# Patient Record
Sex: Female | Born: 1975 | Race: White | Hispanic: No | Marital: Married | Smoking: Never smoker
Health system: Southern US, Community
[De-identification: ages and names within clinical notes are randomized; demographics above are authoritative.]

## PROBLEM LIST (undated history)

## (undated) DIAGNOSIS — Z9289 Personal history of other medical treatment: Secondary | ICD-10-CM

## (undated) DIAGNOSIS — K219 Gastro-esophageal reflux disease without esophagitis: Secondary | ICD-10-CM

## (undated) HISTORY — PX: WISDOM TOOTH EXTRACTION: SHX21

## (undated) HISTORY — DX: Personal history of other medical treatment: Z92.89

---

## 2002-06-27 HISTORY — PX: TUBAL LIGATION: SHX77

## 2009-04-21 ENCOUNTER — Ambulatory Visit: Payer: Self-pay | Admitting: Family Medicine

## 2010-08-02 ENCOUNTER — Ambulatory Visit: Payer: Self-pay

## 2012-06-04 DIAGNOSIS — Z9289 Personal history of other medical treatment: Secondary | ICD-10-CM

## 2012-06-04 HISTORY — DX: Personal history of other medical treatment: Z92.89

## 2012-06-04 LAB — HM MAMMOGRAPHY

## 2014-07-23 DIAGNOSIS — Z9289 Personal history of other medical treatment: Secondary | ICD-10-CM

## 2014-07-23 HISTORY — DX: Personal history of other medical treatment: Z92.89

## 2014-07-23 LAB — HM PAP SMEAR

## 2014-08-08 ENCOUNTER — Emergency Department: Payer: Self-pay | Admitting: Emergency Medicine

## 2014-08-27 ENCOUNTER — Emergency Department: Payer: Self-pay | Admitting: Emergency Medicine

## 2015-12-30 ENCOUNTER — Other Ambulatory Visit: Payer: Self-pay | Admitting: Family Medicine

## 2015-12-30 DIAGNOSIS — Z1231 Encounter for screening mammogram for malignant neoplasm of breast: Secondary | ICD-10-CM

## 2016-01-11 ENCOUNTER — Ambulatory Visit
Admission: RE | Admit: 2016-01-11 | Discharge: 2016-01-11 | Disposition: A | Discharge status: Home / Self Care | Payer: 59 | Source: Ambulatory Visit | Attending: Family Medicine | Admitting: Family Medicine

## 2016-01-11 ENCOUNTER — Other Ambulatory Visit: Payer: Self-pay | Admitting: Family Medicine

## 2016-01-11 DIAGNOSIS — Z1231 Encounter for screening mammogram for malignant neoplasm of breast: Secondary | ICD-10-CM | POA: Insufficient documentation

## 2017-01-03 ENCOUNTER — Other Ambulatory Visit: Payer: Self-pay | Admitting: Family Medicine

## 2017-01-03 DIAGNOSIS — Z1231 Encounter for screening mammogram for malignant neoplasm of breast: Secondary | ICD-10-CM

## 2017-01-04 ENCOUNTER — Encounter: Payer: Self-pay | Admitting: Obstetrics and Gynecology

## 2017-01-05 NOTE — Progress Notes (Signed)
This encounter was created in error - please disregard.

## 2017-01-26 ENCOUNTER — Ambulatory Visit
Admission: RE | Admit: 2017-01-26 | Discharge: 2017-01-26 | Disposition: A | Discharge status: Home / Self Care | Payer: 59 | Source: Ambulatory Visit | Attending: Family Medicine | Admitting: Family Medicine

## 2017-01-26 ENCOUNTER — Ambulatory Visit: Payer: Self-pay | Admitting: Advanced Practice Midwife

## 2017-01-26 DIAGNOSIS — Z1231 Encounter for screening mammogram for malignant neoplasm of breast: Secondary | ICD-10-CM

## 2017-02-23 ENCOUNTER — Ambulatory Visit: Payer: Self-pay | Admitting: Advanced Practice Midwife

## 2017-06-13 ENCOUNTER — Ambulatory Visit (INDEPENDENT_AMBULATORY_CARE_PROVIDER_SITE_OTHER): Payer: 59 | Admitting: Obstetrics and Gynecology

## 2017-06-13 ENCOUNTER — Encounter: Payer: Self-pay | Admitting: Obstetrics and Gynecology

## 2017-06-13 VITALS — BP 112/72 | HR 89 | Ht 62.0 in | Wt 199.0 lb

## 2017-06-13 DIAGNOSIS — Z01419 Encounter for gynecological examination (general) (routine) without abnormal findings: Secondary | ICD-10-CM

## 2017-06-13 DIAGNOSIS — Z1231 Encounter for screening mammogram for malignant neoplasm of breast: Secondary | ICD-10-CM | POA: Diagnosis not present

## 2017-06-13 DIAGNOSIS — Z0000 Encounter for general adult medical examination without abnormal findings: Secondary | ICD-10-CM

## 2017-06-13 DIAGNOSIS — Z1239 Encounter for other screening for malignant neoplasm of breast: Secondary | ICD-10-CM

## 2017-06-13 DIAGNOSIS — Z124 Encounter for screening for malignant neoplasm of cervix: Secondary | ICD-10-CM

## 2017-06-13 NOTE — Progress Notes (Signed)
Gynecology Annual Exam  PCP: Ricardo Jericho, NP  Chief Complaint:  Chief Complaint  Patient presents with  . Gynecologic Exam    History of Present Illness: Patient is a 41 y.o. [id number] presents for annual exam. The patient has no complaints today.   LMP: Patient's last menstrual period was 05/26/2017 (exact date). Average Interval: regular, 28 days Duration of flow: 7 days Heavy Menses: yes Clots: yes Intermenstrual Bleeding: no Postcoital Bleeding: no Dysmenorrhea: no  Reports that the first 4 days of her period she changes a tampon every hour. Also reports that after her menses she has persistent brown discharge for 2 weeks after her period.   The patient is sexually active. She currently uses tubal ligation for contraception. She denies dyspareunia.  The patient does perform self breast exams.  There is no notable family history of breast or ovarian cancer in her family.  The patient wears seatbelts: yes.   The patient has regular exercise: yes.    The patient denies current symptoms of depression.    Review of Systems: ROS  Past Medical History:  Past Medical History:  Diagnosis Date  . History of mammogram 06/04/2012   BENIGN  . History of Papanicolaou smear of cervix 07/23/2014   -/-    Past Surgical History:  Past Surgical History:  Procedure Laterality Date  . CESAREAN SECTION  1997; 2004  . TUBAL LIGATION  2004   [facility]JR  . WISDOM TOOTH EXTRACTION      Gynecologic History:  Patient's last menstrual period was 05/26/2017 (exact date). Contraception: tubal ligation Last Pap: Results were: NIL 2017 Last mammogram: 01/2017 Results were: BI-RAD I Obstetric History: [id number]  Family History:  Family History  Problem Relation Age of Onset  . Heart disease Father   . Hyperlipidemia Father   . Hypertension Father     Social History:  Social History   Socioeconomic History  . Marital status: Married    Spouse name: Not on file  . Number of  children: 2  . Years of education: 27  . Highest education level: Not on file  Social Needs  . Financial resource strain: Not on file  . Food insecurity - worry: Not on file  . Food insecurity - inability: Not on file  . Transportation needs - medical: Not on file  . Transportation needs - non-medical: Not on file  Occupational History  . Occupation: [employer]  Tobacco Use  . Smoking status: Never Smoker  . Smokeless tobacco: Never Used  Substance and Sexual Activity  . Alcohol use: No  . Drug use: No  . Sexual activity: Yes    Birth control/protection: None, Surgical  Other Topics Concern  . Not on file  Social History Narrative  . Not on file    Allergies:  No Known Allergies  Medications: Prior to Admission medications   Medication Sig Start Date End Date Taking? Authorizing Provider  hydrOXYzine (ATARAX/VISTARIL) 25 MG tablet  06/02/17   [provider]    Physical Exam Vitals: Blood pressure 112/72, pulse 89, height 5\' 2"  (1.575 m), weight 199 lb (90.3 kg), last menstrual period 05/26/2017.  OBGyn Exam   Female chaperone present for pelvic and breast  portions of the physical exam  Assessment: 41 y.o. G2P2002 routine annual exam  Plan: Problem List Items Addressed This Visit    None    Visit Diagnoses    Healthcare maintenance    -  Primary   Screening for cervical cancer  Breast cancer screening          1) Mammogram - recommend yearly screening mammogram.  Mammogram Is up to date  2) STI screening was offered and declined  3) ASCCP guidelines and rational discussed.  Patient opts for yearly screening interval  4) Contraception - Education given regarding options for contraception, including Surgical Sterilization including vasectomy.  5) Routine healthcare maintenance including cholesterol, diabetes screening discussed managed by PCP  6) Discussed options for heavy menses including naproxen, IUD, OCP, and ablation. Patient  declines treatment at this time.   7) Discussed options for evaluating persistent brown discharge by sonohystogram or endometrial biopsy. Patient will return if she desires evaluation.

## 2017-06-16 LAB — PAPIG, HPV, RFX 16/18
HPV, HIGH-RISK: NEGATIVE
PAP Smear Comment: 0

## 2017-06-17 NOTE — Progress Notes (Signed)
Called patient and left message that pap smear was normal. Repeat in 5 years.

## 2019-04-04 ENCOUNTER — Other Ambulatory Visit: Payer: Self-pay | Admitting: Physician Assistant

## 2019-04-04 DIAGNOSIS — M62838 Other muscle spasm: Secondary | ICD-10-CM

## 2019-04-04 DIAGNOSIS — R519 Headache, unspecified: Secondary | ICD-10-CM

## 2019-05-16 ENCOUNTER — Ambulatory Visit
Admission: RE | Admit: 2019-05-16 | Discharge: 2019-05-16 | Disposition: A | Discharge status: Home / Self Care | Payer: Managed Care, Other (non HMO) | Source: Ambulatory Visit | Attending: Physician Assistant | Admitting: Physician Assistant

## 2019-05-16 ENCOUNTER — Encounter (INDEPENDENT_AMBULATORY_CARE_PROVIDER_SITE_OTHER): Payer: Self-pay

## 2019-05-16 ENCOUNTER — Other Ambulatory Visit: Payer: Self-pay

## 2019-05-16 DIAGNOSIS — M62838 Other muscle spasm: Secondary | ICD-10-CM | POA: Insufficient documentation

## 2019-05-16 DIAGNOSIS — R519 Headache, unspecified: Secondary | ICD-10-CM | POA: Insufficient documentation

## 2019-08-09 ENCOUNTER — Ambulatory Visit
Admission: EM | Admit: 2019-08-09 | Discharge: 2019-08-09 | Disposition: A | Discharge status: Home / Self Care | Payer: Managed Care, Other (non HMO) | Attending: Emergency Medicine | Admitting: Emergency Medicine

## 2019-08-09 ENCOUNTER — Encounter: Payer: Self-pay | Admitting: Emergency Medicine

## 2019-08-09 ENCOUNTER — Other Ambulatory Visit: Payer: Self-pay

## 2019-08-09 DIAGNOSIS — R067 Sneezing: Secondary | ICD-10-CM | POA: Diagnosis not present

## 2019-08-09 DIAGNOSIS — S39011A Strain of muscle, fascia and tendon of abdomen, initial encounter: Secondary | ICD-10-CM | POA: Diagnosis not present

## 2019-08-09 HISTORY — DX: Gastro-esophageal reflux disease without esophagitis: K21.9

## 2019-08-09 MED ORDER — MELOXICAM 15 MG PO TABS: 15.0000 mg | ORAL_TABLET | Freq: Every day | ORAL | 0 refills | Status: DC

## 2019-08-09 NOTE — ED Provider Notes (Signed)
[facility]    CSN: [id number] Arrival date & time: 08/09/19  1335      History   Chief Complaint Chief Complaint  Patient presents with  . Abdominal Pain    LUQ    HPI Sonia Phillips is a 44 y.o. female.   HPI  44 year old female presents with left upper quadrant abdominal pain that started 2 weeks ago clearly after she sneezed.  This area is mildly tender but has persisted for the 2-week.  Tuesday her primary care office and was prescribed Pepcid for her GERD.  States that this has not been helpful.  Denies any nausea vomiting or diarrhea.  She had no fever or chills.  She has had no shortness of breath.  It is not affected by her sneezing recently.  It is not positional.  No bowel or bladder dysfunction.         Past Medical History:  Diagnosis Date  . GERD (gastroesophageal reflux disease)   . History of mammogram 06/04/2012   BENIGN  . History of Papanicolaou smear of cervix 07/23/2014   -/-    There are no problems to display for this patient.   Past Surgical History:  Procedure Laterality Date  . CESAREAN SECTION  1997; 2004  . TUBAL LIGATION  2004   PJR  . WISDOM TOOTH EXTRACTION      OB History    Gravida  2   Para  2   Term  2   Preterm      AB      Living  2     SAB      TAB      Ectopic      Multiple      Live Births  2            Home Medications    Prior to Admission medications   Medication Sig Start Date End Date Taking? Authorizing Provider  cyclobenzaprine (FLEXERIL) 10 MG tablet Take 5 mg by mouth 3 (three) times daily as needed for muscle spasms.   Yes [provider]  famotidine (PEPCID) 20 MG tablet Take 20 mg by mouth 2 (two) times daily.   Yes [provider]  hydrOXYzine (ATARAX/VISTARIL) 25 MG tablet  06/02/17   [provider]  meloxicam (MOBIC) 15 MG tablet Take 1 tablet (15 mg total) by mouth daily. Take with food 08/09/19   Lorin Picket, PA-C    Family  History Family History  Problem Relation Age of Onset  . Heart disease Father   . Hyperlipidemia Father   . Hypertension Father   . Healthy Mother     Social History Social History   Tobacco Use  . Smoking status: Never Smoker  . Smokeless tobacco: Never Used  Substance Use Topics  . Alcohol use: No  . Drug use: No     Allergies   Patient has no known allergies.   Review of Systems Review of Systems  Constitutional: Positive for activity change. Negative for appetite change, chills, diaphoresis, fatigue and fever.  Musculoskeletal: Positive for back pain and myalgias.  All other systems reviewed and are negative.    Physical Exam Triage Vital Signs ED Triage Vitals  Enc Vitals Group     BP 08/09/19 1417 126/87     Pulse Rate 08/09/19 1417 92     Resp 08/09/19 1417 14     Temp 08/09/19 1417 98.4 F (36.9 C)     Temp Source  08/09/19 1417 Oral     SpO2 08/09/19 1417 100 %     Weight 08/09/19 1414 200 lb (90.7 kg)     Height 08/09/19 1414 5\' 2"  (1.575 m)     Head Circumference --      Peak Flow --      Pain Score 08/09/19 1414 3     Pain Loc --      Pain Edu? --      Excl. in [place]? --    No data found.  Updated Vital Signs BP 126/87 (BP Location: Left Arm)   Pulse 92   Temp 98.4 F (36.9 C) (Oral)   Resp 14   Ht 5\' 2"  (1.575 m)   Wt 200 lb (90.7 kg)   LMP 07/18/2019 (Approximate)   SpO2 100%   BMI 36.58 kg/m   Visual Acuity Right Eye Distance:   Left Eye Distance:   Bilateral Distance:    Right Eye Near:   Left Eye Near:    Bilateral Near:     Physical Exam Vitals and nursing note reviewed. Exam conducted with a chaperone present.  Constitutional:      General: She is not in acute distress.    Appearance: She is well-developed. She is not ill-appearing or toxic-appearing.  HENT:     Head: Normocephalic and atraumatic.  Pulmonary:     Effort: Pulmonary effort is normal.     Breath sounds: Normal breath sounds.  Abdominal:     General:  Bowel sounds are normal. There is no distension. There are signs of injury.     Palpations: Abdomen is soft.     Tenderness: There is abdominal tenderness in the periumbilical area. There is no right CVA tenderness, left CVA tenderness, guarding or rebound.     Hernia: No hernia is present.     Comments: Examination of the abdomen shows no ecchymosis erythema.  Bowel sounds are normal.  There is no costal chondral tenderness.  There is no tenderness sub phrenic.  Area of maximum tenderness is just lateral to the umbilical area on the left in the rectus deep palpation.  This reproduces her symptoms.  She also has pain with lumbar extension is localized to the same area.  Skin:    General: Skin is warm and dry.  Neurological:     General: No focal deficit present.     Mental Status: She is alert and oriented to person, place, and time.  Psychiatric:        Mood and Affect: Mood normal.        Behavior: Behavior normal.      UC Treatments / Results  Labs (all labs ordered are listed, but only abnormal results are displayed) Labs Reviewed - No data to display  EKG   Radiology No results found.  Procedures Procedures (including critical care time)  Medications Ordered in UC Medications - No data to display  Initial Impression / Assessment and Plan / UC Course  I have reviewed the triage vital signs and the nursing notes.  Pertinent labs & imaging results that were available during my care of the patient were reviewed by me and considered in my medical decision making (see chart for details).   44 year old female presents with nominal pain that she experienced after sneezing. Her positioning was in the seated position with her knees drawn up she sneezed.  States that she felt the pain immediately and has remained there for the last 2 weeks.  He was seen  by primary care who treated her for possible GERD with famotidine.  This has not helped.  Examination today shows a very well  localized area of tenderness in the rectus which she likely strained with the sneeze in the positioning she was sitting.  I have prescribed meloxicam that she will take daily with food.  She will continue taking the famotidine.  This may provide some gastric protection.  If she is not improving she should follow-up with her primary care physician.  In addition I have recommended the use of Salonpas or Biofreeze to the area which may be beneficial.   Final Clinical Impressions(s) / UC Diagnoses   Final diagnoses:  Abdominal muscle strain, initial encounter     Discharge Instructions     Take your medications with food.  If you are not improving follow-up with your primary care physician.  Use Biofreeze or Salonpas topically which may be helpful.    ED Prescriptions    Medication Sig Dispense Auth. Provider   meloxicam (MOBIC) 15 MG tablet Take 1 tablet (15 mg total) by mouth daily. Take with food 30 tablet Lorin Picket, PA-C     PDMP not reviewed this encounter.   Lorin Picket, PA-C 08/09/19 1546

## 2019-08-09 NOTE — ED Triage Notes (Signed)
Patient c/o LUQ abdominal pain that started 2 weeks ago after she sneezed.  Patient states that she was seen on Tuesday and was started on Pepcid for GERD.  Patient states that this has not helped.  Patient denies N/V/D.  Patient denies fevers.

## 2019-08-09 NOTE — Discharge Instructions (Addendum)
Take your medications with food.  If you are not improving follow-up with your primary care physician.  Use Biofreeze or Salonpas topically which may be helpful.

## 2019-08-14 ENCOUNTER — Other Ambulatory Visit: Payer: Self-pay | Admitting: Family Medicine

## 2019-08-14 ENCOUNTER — Ambulatory Visit: Payer: 59 | Admitting: Advanced Practice Midwife

## 2019-08-14 DIAGNOSIS — Z1231 Encounter for screening mammogram for malignant neoplasm of breast: Secondary | ICD-10-CM

## 2019-08-21 ENCOUNTER — Ambulatory Visit: Payer: Managed Care, Other (non HMO) | Admitting: Advanced Practice Midwife

## 2019-08-21 ENCOUNTER — Ambulatory Visit: Payer: Managed Care, Other (non HMO)

## 2019-08-28 ENCOUNTER — Ambulatory Visit: Payer: Managed Care, Other (non HMO)

## 2019-10-01 ENCOUNTER — Ambulatory Visit: Payer: Self-pay | Admitting: Family Medicine

## 2019-11-18 ENCOUNTER — Emergency Department
Admission: EM | Admit: 2019-11-18 | Discharge: 2019-11-18 | Disposition: A | Discharge status: Home / Self Care | Payer: Managed Care, Other (non HMO) | Attending: Emergency Medicine | Admitting: Emergency Medicine

## 2019-11-18 ENCOUNTER — Emergency Department: Payer: Managed Care, Other (non HMO)

## 2019-11-18 ENCOUNTER — Other Ambulatory Visit: Payer: Self-pay

## 2019-11-18 DIAGNOSIS — Z79899 Other long term (current) drug therapy: Secondary | ICD-10-CM | POA: Insufficient documentation

## 2019-11-18 DIAGNOSIS — R1012 Left upper quadrant pain: Secondary | ICD-10-CM

## 2019-11-18 DIAGNOSIS — M79605 Pain in left leg: Secondary | ICD-10-CM | POA: Diagnosis not present

## 2019-11-18 LAB — CBC
HCT: 41 % (ref 36.0–46.0)
Hemoglobin: 13.3 g/dL (ref 12.0–15.0)
MCH: 27.5 pg (ref 26.0–34.0)
MCHC: 32.4 g/dL (ref 30.0–36.0)
MCV: 84.7 fL (ref 80.0–100.0)
Platelets: 375 10*3/uL (ref 150–400)
RBC: 4.84 MIL/uL (ref 3.87–5.11)
RDW: 13.7 % (ref 11.5–15.5)
WBC: 8.8 10*3/uL (ref 4.0–10.5)
nRBC: 0 % (ref 0.0–0.2)

## 2019-11-18 LAB — COMPREHENSIVE METABOLIC PANEL
ALT: 17 U/L (ref 0–44)
AST: 22 U/L (ref 15–41)
Albumin: 3.9 g/dL (ref 3.5–5.0)
Alkaline Phosphatase: 69 U/L (ref 38–126)
Anion gap: 11 (ref 5–15)
BUN: 14 mg/dL (ref 6–20)
CO2: 23 mmol/L (ref 22–32)
Calcium: 9.3 mg/dL (ref 8.9–10.3)
Chloride: 103 mmol/L (ref 98–111)
Creatinine, Ser: 0.83 mg/dL (ref 0.44–1.00)
GFR calc Af Amer: >60 mL/min (ref >60)
GFR calc non Af Amer: >60 mL/min (ref >60)
Glucose, Bld: 123 mg/dL — ABNORMAL HIGH (ref 70–99)
Potassium: 4.2 mmol/L (ref 3.5–5.1)
Sodium: 137 mmol/L (ref 135–145)
Total Bilirubin: 0.5 mg/dL (ref 0.3–1.2)
Total Protein: 7.6 g/dL (ref 6.5–8.1)

## 2019-11-18 LAB — LIPASE, BLOOD: Lipase: 40 U/L (ref 11–51)

## 2019-11-18 LAB — URINALYSIS, COMPLETE (UACMP) WITH MICROSCOPIC
Bacteria, UA: NONE SEEN
Bilirubin Urine: NEGATIVE
Glucose, UA: NEGATIVE mg/dL
Hgb urine dipstick: NEGATIVE
Ketones, ur: NEGATIVE mg/dL
Leukocytes,Ua: NEGATIVE
Nitrite: NEGATIVE
Protein, ur: NEGATIVE mg/dL
Specific Gravity, Urine: 1.023 (ref 1.005–1.030)
pH: 5 (ref 5.0–8.0)

## 2019-11-18 MED ORDER — TRAMADOL HCL 50 MG PO TABS: 50.0000 mg | ORAL_TABLET | Freq: Four times a day (QID) | ORAL | 0 refills | Status: DC | PRN

## 2019-11-18 MED ORDER — SODIUM CHLORIDE 0.9% FLUSH: 3.0000 mL | Freq: Once | INTRAVENOUS | Status: DC

## 2019-11-18 NOTE — ED Notes (Signed)
See triage note  Presents with pain to posterior left lower leg  States pain started 2 days ago  Also has been having upper quadrant for several months

## 2019-11-18 NOTE — ED Provider Notes (Signed)
[facility] Emergency Department Provider Note  ____________________________________________   First MD Initiated Contact with Patient 11/18/19 1021     (approximate)  I have reviewed the triage vital signs and the nursing notes.   HISTORY  Chief Complaint Leg Pain and Abdominal Pain    HPI Sonia Phillips is a 44 y.o. female presents emergency department complaining of left lower leg pain that started 2 days ago.  No birth control use, non-smoker, no recent travel.  However patient is very stationary at work.  States pain is in the back of the calf. Complaint #2.  Patient is complaining of left upper quadrant pain.  She has had this since January.  She is scheduled for an endoscopy on Friday.  States it started suddenly in January and felt like a large cramp.  Since then the pain has been constant.  It is located more to the posterior of the trunk than the anterior.      Past Medical History:  Diagnosis Date  . GERD (gastroesophageal reflux disease)   . History of mammogram 06/04/2012   BENIGN  . History of Papanicolaou smear of cervix 07/23/2014   -/-    There are no problems to display for this patient.   Past Surgical History:  Procedure Laterality Date  . CESAREAN SECTION  1997; 2004  . TUBAL LIGATION  2004   PJR  . WISDOM TOOTH EXTRACTION      Prior to Admission medications   Medication Sig Start Date End Date Taking? Authorizing Provider  cyclobenzaprine (FLEXERIL) 10 MG tablet Take 5 mg by mouth 3 (three) times daily as needed for muscle spasms.    [provider]  famotidine (PEPCID) 20 MG tablet Take 20 mg by mouth 2 (two) times daily.    [provider]  hydrOXYzine (ATARAX/VISTARIL) 25 MG tablet  06/02/17   [provider]  meloxicam (MOBIC) 15 MG tablet Take 1 tablet (15 mg total) by mouth daily. Take with food 08/09/19   Lorin Picket, PA-C  traMADol (ULTRAM) 50 MG tablet Take 1 tablet (50 mg  total) by mouth every 6 (six) hours as needed. 11/18/19   Versie Starks, PA-C    Allergies Patient has no known allergies.  Family History  Problem Relation Age of Onset  . Heart disease Father   . Hyperlipidemia Father   . Hypertension Father   . Healthy Mother     Social History Social History   Tobacco Use  . Smoking status: Never Smoker  . Smokeless tobacco: Never Used  Substance Use Topics  . Alcohol use: No  . Drug use: No    Review of Systems  Constitutional: No fever/chills Eyes: No visual changes. ENT: No sore throat. Respiratory: Denies cough Cardiovascular: Denies chest pain Gastrointestinal: Positive abdominal pain Genitourinary: Negative for dysuria. Musculoskeletal: Negative for back pain.  Positive for left leg pain Skin: Negative for rash. Psychiatric: no mood changes,     ____________________________________________   PHYSICAL EXAM:  VITAL SIGNS: ED Triage Vitals  Enc Vitals Group     BP 11/18/19 0929 (!) 148/88     Pulse Rate 11/18/19 0929 (!) 104     Resp 11/18/19 0929 18     Temp 11/18/19 0931 98.2 F (36.8 C)     Temp Source 11/18/19 0929 Oral     SpO2 11/18/19 0929 100 %     Weight 11/18/19 0929 198 lb (89.8 kg)     Height 11/18/19 0929 5'  2" (1.575 m)     Head Circumference --      Peak Flow --      Pain Score 11/18/19 0929 5     Pain Loc --      Pain Edu? --      Excl. in [place]? --     Constitutional: Alert and oriented. Well appearing and in no acute distress. Eyes: Conjunctivae are normal.  Head: Atraumatic. Nose: No congestion/rhinnorhea. Mouth/Throat: Mucous membranes are moist.   Neck:  supple no lymphadenopathy noted Cardiovascular: Normal rate, regular rhythm. Heart sounds are normal Respiratory: Normal respiratory effort.  No retractions, lungs c t a  Abd: soft nontender bs normal all 4 quad, positive for left-sided CVAT GU: deferred Musculoskeletal: FROM all extremities, warm and well perfused, left calf is  tender posteriorly Neurologic:  Normal speech and language.  Skin:  Skin is warm, dry and intact. No rash noted. Psychiatric: Mood and affect are normal. Speech and behavior are normal.  ____________________________________________   LABS (all labs ordered are listed, but only abnormal results are displayed)  Labs Reviewed  COMPREHENSIVE METABOLIC PANEL - Abnormal; Notable for the following components:      Result Value   Glucose, Bld 123 (*)    All other components within normal limits  URINALYSIS, COMPLETE (UACMP) WITH MICROSCOPIC - Abnormal; Notable for the following components:   Color, Urine YELLOW (*)    APPearance CLEAR (*)    All other components within normal limits  LIPASE, BLOOD  CBC  PREGNANCY, URINE   ____________________________________________   ____________________________________________  RADIOLOGY  Ultrasound of the left lower extremity is negative Abdomen and chest 1 views are negative  ____________________________________________   PROCEDURES  Procedure(s) performed: No  Procedures    ____________________________________________   INITIAL IMPRESSION / ASSESSMENT AND PLAN / ED COURSE  Pertinent labs & imaging results that were available during my care of the patient were reviewed by me and considered in my medical decision making (see chart for details).   Patient is a 44 year old female presents emergency department with left lower leg pain left upper quadrant pain.  See HPI physical exam does show patient to appear stable.  Left leg is slightly tender to palpation.  Positive CVA tenderness at the left.  DDx: DVT, muscle cramp, hiatal hernia, kidney stone  CBC is normal, comprehensive metabolic panel is normal, urinalysis is normal, lipase normal.  Ultrasound left lower leg, chest x-ray 1 view and abdomen 1 view ordered   Ultrasound of the left lower extremity does not show a DVT, chest x-ray and one view abdomen are both normal.  I did  explain all the findings to the patient.  Told her her labs and radiology results are very reassuring.  I do feel at this time we should do additional testing as she does have an appointment to have an endoscopic test done on Friday.  I told her the GI people can do things step-by-step with her treatment.  Explained her CT at this time really is not warranted.  She states she understands.  I did offer her pain medication since she is unable to take anti-inflammatories prior to the endoscopy.  She was discharged in stable condition.  Sonia Phillips was evaluated in Emergency Department on 11/18/2019 for the symptoms described in the history of present illness. She was evaluated in the context of the global COVID-19 pandemic, which necessitated consideration that the patient might be at risk for infection with the SARS-CoV-2 virus that causes COVID-19.  Institutional protocols and algorithms that pertain to the evaluation of patients at risk for COVID-19 are in a state of rapid change based on information released by regulatory bodies including the CDC and federal and state organizations. These policies and algorithms were followed during the patient's care in the ED.   As part of my medical decision making, I reviewed the following data within the [place] notes reviewed and incorporated, Labs reviewed , Old chart reviewed, Radiograph reviewed , Notes from prior ED visits and [place] Controlled Substance Database  ____________________________________________   FINAL CLINICAL IMPRESSION(S) / ED DIAGNOSES  Final diagnoses:  LUQ pain  Left leg pain      NEW MEDICATIONS STARTED DURING THIS VISIT:  Discharge Medication List as of 11/18/2019 12:09 PM    START taking these medications   Details  traMADol (ULTRAM) 50 MG tablet Take 1 tablet (50 mg total) by mouth every 6 (six) hours as needed., Starting Mon 11/18/2019, Normal         Note:  This document was prepared using  Dragon voice recognition software and may include unintentional dictation errors.    Versie Starks, PA-C 11/18/19 1357    Lavonia Drafts, MD 11/18/19 1414

## 2019-11-18 NOTE — Discharge Instructions (Addendum)
Follow-up with [facility] clinic for your endoscopy on Friday.  Return emergency department worsening.  Take pain medication as needed.

## 2019-11-18 NOTE — ED Triage Notes (Signed)
Pt c/o left calf pain for the past couple of days , denies injury, no noted swelling or redness. Pt also c/o LUQ pain since January and states her PCP has been treating her for GERD. Denies N/V/D. Pt is in NAD.

## 2019-11-22 HISTORY — PX: UPPER GI ENDOSCOPY: SHX6162

## 2019-12-10 ENCOUNTER — Emergency Department
Admission: EM | Admit: 2019-12-10 | Discharge: 2019-12-10 | Disposition: A | Discharge status: Home / Self Care | Payer: Managed Care, Other (non HMO) | Attending: Emergency Medicine | Admitting: Emergency Medicine

## 2019-12-10 ENCOUNTER — Other Ambulatory Visit: Payer: Self-pay

## 2019-12-10 DIAGNOSIS — R220 Localized swelling, mass and lump, head: Secondary | ICD-10-CM | POA: Diagnosis present

## 2019-12-10 DIAGNOSIS — Z79899 Other long term (current) drug therapy: Secondary | ICD-10-CM | POA: Diagnosis not present

## 2019-12-10 DIAGNOSIS — T7840XA Allergy, unspecified, initial encounter: Secondary | ICD-10-CM | POA: Insufficient documentation

## 2019-12-10 LAB — GROUP A STREP BY PCR: Group A Strep by PCR: NOT DETECTED

## 2019-12-10 MED ORDER — EPINEPHRINE 0.3 MG/0.3ML IJ SOAJ: 0.3000 mg | INTRAMUSCULAR | 1 refills | Status: DC | PRN

## 2019-12-10 MED ORDER — EPINEPHRINE 0.3 MG/0.3ML IJ SOAJ: 0.3000 mg | Freq: Once | INTRAMUSCULAR | Status: AC

## 2019-12-10 MED ORDER — PREDNISONE 20 MG PO TABS: 60.0000 mg | ORAL_TABLET | Freq: Every day | ORAL | 0 refills | Status: AC

## 2019-12-10 MED ORDER — METHYLPREDNISOLONE SODIUM SUCC 125 MG IJ SOLR: 125.0000 mg | Freq: Once | INTRAMUSCULAR | Status: AC

## 2019-12-10 MED ORDER — EPINEPHRINE 0.3 MG/0.3ML IJ SOAJ
INTRAMUSCULAR | Status: AC
  Administered 2019-12-10: 0.3 mg via INTRAMUSCULAR
  Filled 2019-12-10: qty 0.3

## 2019-12-10 MED ORDER — DIPHENHYDRAMINE HCL 50 MG/ML IJ SOLN
INTRAMUSCULAR | Status: AC
  Administered 2019-12-10: 50 mg via INTRAVENOUS
  Filled 2019-12-10: qty 1

## 2019-12-10 MED ORDER — FAMOTIDINE IN NACL 20-0.9 MG/50ML-% IV SOLN
20.0000 mg | Freq: Once | INTRAVENOUS | Status: AC
  Administered 2019-12-10: 20 mg via INTRAVENOUS

## 2019-12-10 MED ORDER — METHYLPREDNISOLONE SODIUM SUCC 125 MG IJ SOLR
INTRAMUSCULAR | Status: AC
  Administered 2019-12-10: 125 mg via INTRAVENOUS
  Filled 2019-12-10: qty 2

## 2019-12-10 MED ORDER — DIPHENHYDRAMINE HCL 50 MG/ML IJ SOLN: 50.0000 mg | Freq: Once | INTRAMUSCULAR | Status: AC

## 2019-12-10 NOTE — ED Triage Notes (Signed)
Pt arrives to ED via POV from home with c/o "throat swelling" that woke her from sleep about 1 hr PTA. Pt states she feels like "the little thing hanging back down in the back of my throat is swollen". Pt denies any previous h/x' of allergic reactions. No new medications; no new soaps, detergents, or lotions; pt does report going camping last weekend but unsure of any environmental exposure. Pt is A&O, in NAD; RR even, regular, and unlabored.

## 2019-12-10 NOTE — Discharge Instructions (Signed)
Use the EpiPen if you have a throat closing sensation.  Otherwise take steroids once a day for the next 4 days.  Follow-up with your doctor to be tested for possible allergies.  In the meantime I would recommend avoiding eating what ever you had last night for dinner.

## 2019-12-10 NOTE — ED Notes (Signed)
Pt PO challenged 

## 2019-12-10 NOTE — ED Provider Notes (Signed)
[facility] Emergency Department Provider Note  ____________________________________________  Time seen: Approximately 3:02 AM  I have reviewed the triage vital signs and the nursing notes.   HISTORY  Chief Complaint Oral Swelling   HPI Sonia Phillips is a 44 y.o. female with no significant past medical history who presents for evaluation of throat swelling.  Patient reports that she went to a seafood restaurant last night and ate flounder which she usually does.  She was feeling fine when she went to bed.  She woke up in the middle of the night and felt like her throat was closing and having difficulty breathing.  No nausea or vomiting, no diarrhea, no hives or rash, no chest pain.  She is not on an ACE inhibitor.  She denies any prior history of anaphylaxis.  She reports that her symptoms are improving but not fully resolved.   Past Medical History:  Diagnosis Date  . GERD (gastroesophageal reflux disease)   . History of mammogram 06/04/2012   BENIGN  . History of Papanicolaou smear of cervix 07/23/2014   -/-     Past Surgical History:  Procedure Laterality Date  . CESAREAN SECTION  1997; 2004  . TUBAL LIGATION  2004   [facility]  . WISDOM TOOTH EXTRACTION      Prior to Admission medications   Medication Sig Start Date End Date Taking? Authorizing Provider  acetaminophen (TYLENOL) 325 MG tablet Take by mouth every 6 (six) hours as needed.   Yes [provider]  Multiple Vitamin (MULTIVITAMIN WITH MINERALS) TABS tablet Take 1 tablet by mouth daily.   Yes [provider]  EPINEPHrine 0.3 mg/0.3 mL IJ SOAJ injection Inject 0.3 mLs (0.3 mg total) into the muscle as needed for anaphylaxis. 12/10/19   Rudene Re, MD  predniSONE (DELTASONE) 20 MG tablet Take 3 tablets (60 mg total) by mouth daily for 4 days. 12/10/19 12/14/19  Rudene Re, MD    Allergies Patient has no known allergies.  Family History  Problem Relation Age  of Onset  . Heart disease Father   . Hyperlipidemia Father   . Hypertension Father   . Healthy Mother     Social History Social History   Tobacco Use  . Smoking status: Never Smoker  . Smokeless tobacco: Never Used  Vaping Use  . Vaping Use: Never used  Substance Use Topics  . Alcohol use: No  . Drug use: No    Review of Systems  Constitutional: Negative for fever. Eyes: Negative for visual changes. ENT: Negative for sore throat. + throat swelling Neck: No neck pain  Cardiovascular: Negative for chest pain. Respiratory: Negative for shortness of breath. Gastrointestinal: Negative for abdominal pain, vomiting or diarrhea. Genitourinary: Negative for dysuria. Musculoskeletal: Negative for back pain. Skin: Negative for rash. Neurological: Negative for headaches, weakness or numbness. Psych: No SI or HI  ____________________________________________   PHYSICAL EXAM:  VITAL SIGNS: ED Triage Vitals  Enc Vitals Group     BP 12/10/19 0234 126/88     Pulse Rate 12/10/19 0234 92     Resp 12/10/19 0234 17     Temp 12/10/19 0234 (!) 97.5 F (36.4 C)     Temp Source 12/10/19 0234 Oral     SpO2 12/10/19 0234 99 %     Weight 12/10/19 0233 199 lb (90.3 kg)     Height 12/10/19 0233 5' (1.524 m)     Head Circumference --      Peak Flow --  Pain Score 12/10/19 0233 0     Pain Loc --      Pain Edu? --      Excl. in [place]? --     Constitutional: Alert and oriented. Well appearing and in no apparent distress. HEENT:      Head: Normocephalic and atraumatic.         Eyes: Conjunctivae are normal. Sclera is non-icteric.       Mouth/Throat: Mucous membranes are moist.  No angioedema, uvula and tonsils look swollen with no exudate or erythema.      Neck: Supple with no signs of meningismus.  No stridor. Cardiovascular: Regular rate and rhythm. No murmurs, gallops, or rubs.  Respiratory: Normal respiratory effort. Lungs are clear to auscultation bilaterally.  Gastrointestinal:  Soft, non tender. Musculoskeletal:  No edema, cyanosis, or erythema of extremities. Neurologic: Normal speech and language. Face is symmetric. Moving all extremities. No gross focal neurologic deficits are appreciated. Skin: Skin is warm, dry and intact. No rash noted. Psychiatric: Mood and affect are normal. Speech and behavior are normal.  ____________________________________________   LABS (all labs ordered are listed, but only abnormal results are displayed)  Labs Reviewed  GROUP A STREP BY PCR   ____________________________________________  EKG  none  ____________________________________________  RADIOLOGY  none  ____________________________________________   PROCEDURES  Procedure(s) performed:yes .1-3 Lead EKG Interpretation Performed by: Rudene Re, MD Authorized by: Rudene Re, MD     Interpretation: normal     ECG rate assessment: normal     Rhythm: sinus rhythm     Ectopy: none     Critical Care performed:  None ____________________________________________   INITIAL IMPRESSION / ASSESSMENT AND PLAN / ED COURSE  44 y.o. female with no significant past medical history who presents for evaluation of sudden onset of throat swelling and difficulty breathing.  Patient does have swollen uvula and tonsils with no erythema or exudate.  At this time presentation is concerning for an allergic reaction to an unknown allergen.  No signs of anaphylaxis with only one system involved however will administer EpiPen due to potential airway compromise.  At this time patient is handling her saliva, breathing comfortably and normal with no stridor and no wheezing.  Will monitor closely.  Will give IV steroids, Benadryl, and Pepcid.  Old medical records reviewed.  _________________________ 3:44 AM on 12/10/2019 ----------------------------------------- No improvement of symptoms with epipen. Will get strep swab  _________________________ 4:44 AM on  12/10/2019 -----------------------------------------  Strep negative. Swelling markedly improved probably from the steroids as patient initially reported no improvement with epipen. No signs of anaphylaxis. Will continue to monitor for 4 hours post epinephrine for recurrence of symptoms .    _________________________ 6:45 AM on 12/10/2019 -----------------------------------------  Swelling has completely resolved.  Will discharge home on steroids and with an EpiPen.  Recommended avoiding what ever she ate yesterday until she can be tested by an allergist to prevent a more severe allergic reaction.  Discussed instructions on how to use an EpiPen and indications for it.  Discussed my standard return precautions and close follow-up _____________________________________________ Please note:  Patient was evaluated in Emergency Department today for the symptoms described in the history of present illness. Patient was evaluated in the context of the global COVID-19 pandemic, which necessitated consideration that the patient might be at risk for infection with the SARS-CoV-2 virus that causes COVID-19. Institutional protocols and algorithms that pertain to the evaluation of patients at risk for COVID-19 are in a state of rapid change  based on information released by regulatory bodies including the CDC and federal and state organizations. These policies and algorithms were followed during the patient's care in the ED.  Some ED evaluations and interventions may be delayed as a result of limited staffing during the pandemic.   [place] Controlled Substance Database was reviewed by me. ____________________________________________   FINAL CLINICAL IMPRESSION(S) / ED DIAGNOSES   Final diagnoses:  Allergic reaction, initial encounter      NEW MEDICATIONS STARTED DURING THIS VISIT:  ED Discharge Orders         Ordered    predniSONE (DELTASONE) 20 MG tablet  Daily     Discontinue  Reprint     12/10/19 0530     EPINEPHrine 0.3 mg/0.3 mL IJ SOAJ injection  As needed     Discontinue  Reprint     12/10/19 0530           Note:  This document was prepared using Dragon voice recognition software and may include unintentional dictation errors.    Alfred Levins, [place], MD 12/10/19 [phone]

## 2020-02-17 ENCOUNTER — Other Ambulatory Visit: Payer: Self-pay

## 2020-02-17 ENCOUNTER — Ambulatory Visit
Admission: RE | Admit: 2020-02-17 | Discharge: 2020-02-17 | Disposition: A | Discharge status: Home / Self Care | Payer: Managed Care, Other (non HMO) | Source: Ambulatory Visit | Attending: Family Medicine | Admitting: Family Medicine

## 2020-02-17 DIAGNOSIS — Z1231 Encounter for screening mammogram for malignant neoplasm of breast: Secondary | ICD-10-CM | POA: Diagnosis not present

## 2020-02-26 ENCOUNTER — Other Ambulatory Visit: Payer: Self-pay

## 2020-02-26 ENCOUNTER — Ambulatory Visit (INDEPENDENT_AMBULATORY_CARE_PROVIDER_SITE_OTHER): Payer: Managed Care, Other (non HMO) | Admitting: Advanced Practice Midwife

## 2020-02-26 ENCOUNTER — Encounter: Payer: Self-pay | Admitting: Advanced Practice Midwife

## 2020-02-26 VITALS — BP 107/74 | HR 96 | Ht 62.0 in | Wt 214.0 lb

## 2020-02-26 DIAGNOSIS — Z124 Encounter for screening for malignant neoplasm of cervix: Secondary | ICD-10-CM

## 2020-02-26 DIAGNOSIS — Z01419 Encounter for gynecological examination (general) (routine) without abnormal findings: Secondary | ICD-10-CM | POA: Diagnosis not present

## 2020-02-26 DIAGNOSIS — E669 Obesity, unspecified: Secondary | ICD-10-CM

## 2020-02-26 MED ORDER — PHENTERMINE HCL 37.5 MG PO TABS: 37.5000 mg | ORAL_TABLET | Freq: Every day | ORAL | 1 refills | Status: AC

## 2020-02-26 NOTE — Progress Notes (Signed)
Gynecology Annual Exam  Date of Service: 02/26/2020  PCP: Ellene Route  Chief Complaint:  Chief Complaint  Patient presents with  . Gynecologic Exam    History of Present Illness: Patient is a 44 y.o. G2P2002 presents for annual exam. The patient has complaint today of 40 pound weight gain in the past year despite "trying to do everything right". She walks 30 minutes per day and otherwise is sedentary at her job with [employer]. She eats a modified Keto diet and admits increased intake of fried foods and snacking throughout the day at her job. She drinks water and a diet dr pepper most days of the week. She gets about 8 hours sleep per night although she wakes tired due to snoring. She admits increased stress the past year due to Covid and her job. She had an endoscopy in May for heartburn. She uses boric acid suppositories with good relief to help prevent vaginitis.  She has used phentermine in the past which helped her lose some weight and she requests a prescription today. We also discussed increasing healthy lifestyle as phentermine is not a substitute for healthy diet and increased exercise. I offered a short course of phentermine and to follow up with MD past 2 months of treatment. She knows to return monthly for blood pressure and weight checks.   LMP: Patient's last menstrual period was 02/12/2020. Average Interval: regular, 30 days Duration of flow: 4-5 days Heavy Menses: yes Clots: no Intermenstrual Bleeding: no Postcoital Bleeding: no Dysmenorrhea: no   The patient is sexually active. She currently uses tubal ligation for contraception. She denies dyspareunia.  The patient does perform self breast exams.  There is no notable family history of breast or ovarian cancer in her family.  The patient wears seatbelts: yes.      The patient denies current symptoms of depression.    Review of Systems: Review of Systems  Constitutional: Negative for chills and fever.   HENT: Negative for congestion, ear discharge, ear pain, hearing loss, sinus pain and sore throat.   Eyes: Negative for blurred vision and double vision.  Respiratory: Negative for cough, shortness of breath and wheezing.   Cardiovascular: Negative for chest pain, palpitations and leg swelling.  Gastrointestinal: Negative for abdominal pain, blood in stool, constipation, diarrhea, heartburn, melena, nausea and vomiting.  Genitourinary: Negative for dysuria, flank pain, frequency, hematuria and urgency.  Musculoskeletal: Negative for back pain, joint pain and myalgias.  Skin: Negative for itching and rash.  Neurological: Negative for dizziness, tingling, tremors, sensory change, speech change, focal weakness, seizures, loss of consciousness, weakness and headaches.  Endo/Heme/Allergies: Negative for environmental allergies. Does not bruise/bleed easily.       Positive for weight gain  Psychiatric/Behavioral: Negative for depression, hallucinations, memory loss, substance abuse and suicidal ideas. The patient is not nervous/anxious and does not have insomnia.     Past Medical History:  There are no problems to display for this patient.   Past Surgical History:  Past Surgical History:  Procedure Laterality Date  . CESAREAN SECTION  1997; 2004  . TUBAL LIGATION  2004   [facility]  . UPPER GI ENDOSCOPY  11/22/2019  . WISDOM TOOTH EXTRACTION      Gynecologic History:  Patient's last menstrual period was 02/12/2020. Contraception: tubal ligation Last Pap: 3 years ago Results were:  no abnormalities  Last mammogram: 1 week ago Results were: BI-RAD I  Obstetric History: [id number]  Family History:  Family History  Problem Relation Age  of Onset  . Heart disease Father   . Hyperlipidemia Father   . Hypertension Father   . Healthy Mother   . Breast cancer Neg Hx     Social History:  Social History   Socioeconomic History  . Marital status: Married    Spouse name: Not on file  . Number  of children: 2  . Years of education: 38  . Highest education level: Not on file  Occupational History  . Occupation: [employer]  Tobacco Use  . Smoking status: Never Smoker  . Smokeless tobacco: Never Used  Vaping Use  . Vaping Use: Never used  Substance and Sexual Activity  . Alcohol use: No  . Drug use: No  . Sexual activity: Yes    Birth control/protection: None, Surgical    Comment: Tubal Ligation  Other Topics Concern  . Not on file  Social History Narrative  . Not on file   Social Determinants of Health   Financial Resource Strain:   . Difficulty of Paying Living Expenses: Not on file  Food Insecurity:   . Worried About Charity fundraiser in the Last Year: Not on file  . Ran Out of Food in the Last Year: Not on file  Transportation Needs:   . Lack of Transportation (Medical): Not on file  . Lack of Transportation (Non-Medical): Not on file  Physical Activity:   . Days of Exercise per Week: Not on file  . Minutes of Exercise per Session: Not on file  Stress:   . Feeling of Stress : Not on file  Social Connections:   . Frequency of Communication with Friends and Family: Not on file  . Frequency of Social Gatherings with Friends and Family: Not on file  . Attends Religious Services: Not on file  . Active Member of Clubs or Organizations: Not on file  . Attends Archivist Meetings: Not on file  . Marital Status: Not on file  Intimate Partner Violence:   . Fear of Current or Ex-Partner: Not on file  . Emotionally Abused: Not on file  . Physically Abused: Not on file  . Sexually Abused: Not on file    Allergies:  No Known Allergies  Medications: Prior to Admission medications   Medication Sig Start Date End Date Taking? Authorizing Provider  acetaminophen (TYLENOL) 325 MG tablet Take by mouth every 6 (six) hours as needed.   Yes [provider]  EPINEPHrine 0.3 mg/0.3 mL IJ SOAJ injection Inject 0.3 mLs (0.3 mg total) into the muscle as  needed for anaphylaxis. 12/10/19  Yes [place], [place], MD  Multiple Vitamin (MULTIVITAMIN WITH MINERALS) TABS tablet Take 1 tablet by mouth daily.   Yes [provider]  phentermine (ADIPEX-P) 37.5 MG tablet Take 1 tablet (37.5 mg total) by mouth daily before breakfast. 02/26/20   Rod Can, CNM    Physical Exam Vitals: Blood pressure 107/74, pulse 96, height 5\' 2"  (1.575 m), weight 214 lb (97.1 kg), last menstrual period 02/12/2020.  General: NAD HEENT: normocephalic, anicteric Thyroid: no enlargement, no palpable nodules Pulmonary: No increased work of breathing, CTAB Cardiovascular: RRR, distal pulses 2+ Breast: Breast symmetrical, no tenderness, no palpable nodules or masses, no skin or nipple retraction present, no nipple discharge.  No axillary or supraclavicular lymphadenopathy. Abdomen: NABS, soft, non-tender, non-distended.  Umbilicus without lesions.  No hepatomegaly, splenomegaly or masses palpable. No evidence of hernia  Genitourinary:  External: Normal external female genitalia.  Normal urethral meatus, normal Bartholin's and Skene's glands.  Vagina: Normal vaginal mucosa, no evidence of prolapse.    Cervix: Grossly normal in appearance, no bleeding. No CMT  Uterus: Non-enlarged, mobile, normal contour.    Adnexa: ovaries non-enlarged, no adnexal masses  Rectal: deferred  Lymphatic: no evidence of inguinal lymphadenopathy Extremities: no edema, erythema, or tenderness Neurologic: Grossly intact Psychiatric: mood appropriate, affect full    Assessment: 44 y.o. G2P2002 routine annual exam  Plan: Problem List Items Addressed This Visit    None    Visit Diagnoses    Well woman exam with routine gynecological exam    -  Primary   Relevant Orders   IGP, Aptima HPV   Cervical cancer screening       Relevant Orders   IGP, Aptima HPV   Obesity, Class II, BMI 35-39.9       Relevant Medications   phentermine (ADIPEX-P) 37.5 MG tablet      1) Mammogram  - recommend yearly screening mammogram.  Mammogram Is up to date  2) STI screening  was offered and declined  3) ASCCP guidelines and rationale discussed.  Patient opts for every 3 years screening interval  4) Contraception - the patient is currently using  tubal ligation.  She is happy with her current form of contraception and plans to continue  5) Colonoscopy -- Screening recommended starting at age 30 for average risk individuals, age 75 for individuals deemed at increased risk (including African Americans) and recommended to continue until age 34.  For patient age 52-85 individualized approach is recommended.  Gold standard screening is via colonoscopy, Cologuard screening is an acceptable alternative for patient unwilling or unable to undergo colonoscopy.  "Colorectal cancer screening for average?risk adults: 2018 guideline update from the American Cancer Society"CA: A Cancer Journal for Clinicians: Nov 23, 2016   6) Routine healthcare maintenance including cholesterol, diabetes screening discussed managed by PCP   7) Weight gain: phentermine Rx for 1 month with 1 refill. Return to clinic in 1 month for blood pressure and weight check.   8) Return in about 4 weeks (around 03/25/2020) for bp and wt checks.   Rod Can, [facility] 02/26/2020, 2:18 PM

## 2020-02-26 NOTE — Patient Instructions (Signed)
Health Maintenance, Female Adopting a healthy lifestyle and getting preventive care are important in promoting health and wellness. Ask your health care provider about:  The right schedule for you to have regular tests and exams.  Things you can do on your own to prevent diseases and keep yourself healthy. What should I know about diet, weight, and exercise? Eat a healthy diet   Eat a diet that includes plenty of vegetables, fruits, low-fat dairy products, and lean protein.  Do not eat a lot of foods that are high in solid fats, added sugars, or sodium. Maintain a healthy weight Body mass index (BMI) is used to identify weight problems. It estimates body fat based on height and weight. Your health care provider can help determine your BMI and help you achieve or maintain a healthy weight. Get regular exercise Get regular exercise. This is one of the most important things you can do for your health. Most adults should:  Exercise for at least 150 minutes each week. The exercise should increase your heart rate and make you sweat (moderate-intensity exercise).  Do strengthening exercises at least twice a week. This is in addition to the moderate-intensity exercise.  Spend less time sitting. Even light physical activity can be beneficial. Watch cholesterol and blood lipids Have your blood tested for lipids and cholesterol at 45 years of age, then have this test every 5 years. Have your cholesterol levels checked more often if:  Your lipid or cholesterol levels are high.  You are older than 45 years of age.  You are at high risk for heart disease. What should I know about cancer screening? Depending on your health history and family history, you may need to have cancer screening at various ages. This may include screening for:  Breast cancer.  Cervical cancer.  Colorectal cancer.  Skin cancer.  Lung cancer. What should I know about heart disease, diabetes, and high blood  pressure? Blood pressure and heart disease  High blood pressure causes heart disease and increases the risk of stroke. This is more likely to develop in people who have high blood pressure readings, are of African descent, or are overweight.  Have your blood pressure checked: ? Every 3-5 years if you are 18-39 years of age. ? Every year if you are 40 years old or older. Diabetes Have regular diabetes screenings. This checks your fasting blood sugar level. Have the screening done:  Once every three years after age 40 if you are at a normal weight and have a low risk for diabetes.  More often and at a younger age if you are overweight or have a high risk for diabetes. What should I know about preventing infection? Hepatitis B If you have a higher risk for hepatitis B, you should be screened for this virus. Talk with your health care provider to find out if you are at risk for hepatitis B infection. Hepatitis C Testing is recommended for:  Everyone born from 1945 through 1965.  Anyone with known risk factors for hepatitis C. Sexually transmitted infections (STIs)  Get screened for STIs, including gonorrhea and chlamydia, if: ? You are sexually active and are younger than 45 years of age. ? You are older than 45 years of age and your health care provider tells you that you are at risk for this type of infection. ? Your sexual activity has changed since you were last screened, and you are at increased risk for chlamydia or gonorrhea. Ask your health care provider if   you are at risk.  Ask your health care provider about whether you are at high risk for HIV. Your health care provider may recommend a prescription medicine to help prevent HIV infection. If you choose to take medicine to prevent HIV, you should first get tested for HIV. You should then be tested every 3 months for as long as you are taking the medicine. Pregnancy  If you are about to stop having your period (premenopausal) and  you may become pregnant, seek counseling before you get pregnant.  Take 400 to 800 micrograms (mcg) of folic acid every day if you become pregnant.  Ask for birth control (contraception) if you want to prevent pregnancy. Osteoporosis and menopause Osteoporosis is a disease in which the bones lose minerals and strength with aging. This can result in bone fractures. If you are 65 years old or older, or if you are at risk for osteoporosis and fractures, ask your health care provider if you should:  Be screened for bone loss.  Take a calcium or vitamin D supplement to lower your risk of fractures.  Be given hormone replacement therapy (HRT) to treat symptoms of menopause. Follow these instructions at home: Lifestyle  Do not use any products that contain nicotine or tobacco, such as cigarettes, e-cigarettes, and chewing tobacco. If you need help quitting, ask your health care provider.  Do not use street drugs.  Do not share needles.  Ask your health care provider for help if you need support or information about quitting drugs. Alcohol use  Do not drink alcohol if: ? Your health care provider tells you not to drink. ? You are pregnant, may be pregnant, or are planning to become pregnant.  If you drink alcohol: ? Limit how much you use to 0-1 drink a day. ? Limit intake if you are breastfeeding.  Be aware of how much alcohol is in your drink. In the [place] one drink equals one 12 oz bottle of beer (355 mL), one 5 oz glass of wine (148 mL), or one 1 oz glass of hard liquor (44 mL). General instructions  Schedule regular health, dental, and eye exams.  Stay current with your vaccines.  Tell your health care provider if: ? You often feel depressed. ? You have ever been abused or do not feel safe at home. Summary  Adopting a healthy lifestyle and getting preventive care are important in promoting health and wellness.  Follow your health care provider's instructions about healthy  diet, exercising, and getting tested or screened for diseases.  Follow your health care provider's instructions on monitoring your cholesterol and blood pressure. This information is not intended to replace advice given to you by your health care provider. Make sure you discuss any questions you have with your health care provider. Document Revised: 06/06/2018 Document Reviewed: 06/06/2018 Elsevier Patient Education  2020 [employer]  

## 2020-03-02 LAB — IGP, APTIMA HPV
HPV Aptima: NEGATIVE
PAP Smear Comment: 0

## 2020-03-25 ENCOUNTER — Ambulatory Visit: Payer: Managed Care, Other (non HMO) | Admitting: Obstetrics and Gynecology

## 2020-07-30 ENCOUNTER — Other Ambulatory Visit: Payer: Self-pay

## 2020-07-30 ENCOUNTER — Encounter: Payer: Self-pay | Admitting: Obstetrics and Gynecology

## 2020-07-30 ENCOUNTER — Ambulatory Visit: Payer: Managed Care, Other (non HMO) | Admitting: Obstetrics and Gynecology

## 2020-07-30 VITALS — BP 122/74 | Ht 62.0 in | Wt 217.0 lb

## 2020-07-30 DIAGNOSIS — R875 Abnormal microbiological findings in specimens from female genital organs: Secondary | ICD-10-CM

## 2020-07-30 DIAGNOSIS — N920 Excessive and frequent menstruation with regular cycle: Secondary | ICD-10-CM

## 2020-07-30 DIAGNOSIS — N923 Ovulation bleeding: Secondary | ICD-10-CM

## 2020-07-30 DIAGNOSIS — Z124 Encounter for screening for malignant neoplasm of cervix: Secondary | ICD-10-CM

## 2020-07-30 DIAGNOSIS — N939 Abnormal uterine and vaginal bleeding, unspecified: Secondary | ICD-10-CM

## 2020-07-30 DIAGNOSIS — N930 Postcoital and contact bleeding: Secondary | ICD-10-CM | POA: Diagnosis not present

## 2020-07-30 DIAGNOSIS — N760 Acute vaginitis: Secondary | ICD-10-CM

## 2020-07-30 NOTE — Progress Notes (Signed)
Patient ID: Sonia Phillips, female   DOB: [birth year], 45 y.o.   MRN: [id number]  Reason for Consult: Gynecologic Exam   Referred by Ellene Route  Subjective:     HPI:  Sonia Phillips is a 45 y.o. female. She presents today with complaints of new onset brown vaginal bleeding after intercourse. This is a new symptoms which started this week.   She reports that she has had a history of heavy menstrual cycles for 18 years since her tubal ligation. She reports that she has a monthly menstrual cycle. She generally has 6 days of bleeding, but she has noticed a week for prolonged brown spotting after her menstrual cycle.   She reports that for some time she has been using over the counter boric acid vaginal suppositories 3 times a week because she was having issues with recurrent vaginitis in the past  Gynecological History Menarche: 13 LMP: 07/08/2020 Last pap smear: insufficient Last Mammogram: BIRADS 1 History of STDs: none Sexually Active: yes, new onset vaginal bleeding after intercourse. No pain with intercourse  Identifies as  A female, sexually active with men  Obstetrical History OB History  Gravida Para Term Preterm AB Living  2 2 2     2   SAB IAB Ectopic Multiple Live Births          2    # Outcome Date GA Lbr Len/2nd Weight Sex Delivery Anes PTL Lv  2 Term 01/23/03   8 lb 6 oz (3.799 kg) M CS-LTranv   LIV  1 Term 10/22/95   8 lb 6 oz (3.799 kg) F CS-LTranv   LIV     Past Medical History:  Diagnosis Date  . GERD (gastroesophageal reflux disease)   . History of mammogram 06/04/2012   BENIGN  . History of Papanicolaou smear of cervix 07/23/2014   -/-   Family History  Problem Relation Age of Onset  . Heart disease Father   . Hyperlipidemia Father   . Hypertension Father   . Healthy Mother   . Breast cancer Neg Hx    Past Surgical History:  Procedure Laterality Date  . CESAREAN SECTION  1997; 2004  . TUBAL LIGATION  2004   PJR  . UPPER GI  ENDOSCOPY  11/22/2019  . WISDOM TOOTH EXTRACTION      Short Social History:  Social History   Tobacco Use  . Smoking status: Never Smoker  . Smokeless tobacco: Never Used  Substance Use Topics  . Alcohol use: No    No Known Allergies  Current Outpatient Medications  Medication Sig Dispense Refill  . acetaminophen (TYLENOL) 325 MG tablet Take by mouth every 6 (six) hours as needed. (Patient not taking: Reported on 07/30/2020)    . EPINEPHrine 0.3 mg/0.3 mL IJ SOAJ injection Inject 0.3 mLs (0.3 mg total) into the muscle as needed for anaphylaxis. (Patient not taking: Reported on 07/30/2020) 1 each 1  . Multiple Vitamin (MULTIVITAMIN WITH MINERALS) TABS tablet Take 1 tablet by mouth daily.    . phentermine (ADIPEX-P) 37.5 MG tablet Take 1 tablet (37.5 mg total) by mouth daily before breakfast. (Patient not taking: Reported on 07/30/2020) 30 tablet 1   No current facility-administered medications for this visit.    Review of Systems  Constitutional: Negative for chills, fatigue, fever and unexpected weight change.  HENT: Negative for trouble swallowing.  Eyes: Negative for loss of vision.  Respiratory: Negative for cough, shortness of breath and wheezing.  Cardiovascular: Negative for chest pain,  leg swelling, palpitations and syncope.  GI: Negative for abdominal pain, blood in stool, diarrhea, nausea and vomiting.  GU: Negative for difficulty urinating, dysuria, frequency and hematuria.  Musculoskeletal: Negative for back pain, leg pain and joint pain.  Skin: Negative for rash.  Neurological: Negative for dizziness, headaches, light-headedness, numbness and seizures.  Psychiatric: Negative for behavioral problem, confusion, depressed mood and sleep disturbance.        Objective:  Objective   Vitals:   07/30/20 1520  BP: 122/74  Weight: 217 lb (98.4 kg)  Height: 5\' 2"  (1.575 m)   Body mass index is 39.69 kg/m.  Physical Exam Vitals and nursing note reviewed.   Constitutional:      Appearance: She is well-developed and well-nourished.  HENT:     Head: Normocephalic and atraumatic.  Eyes:     Extraocular Movements: EOM normal.     Pupils: Pupils are equal, round, and reactive to light.  Cardiovascular:     Rate and Rhythm: Normal rate and regular rhythm.  Pulmonary:     Effort: Pulmonary effort is normal. No respiratory distress.  Genitourinary:    Comments: External: Normal appearing vulva. No lesions noted.  Speculum examination: Normal appearing cervix. Small brown blood at the vaginal os.  Bimanual examination: Uterus midline, non-tender, slight enlargement to 10 cm,  normal shape and contour.  No CMT. No adnexal masses. No adnexal tenderness. Pelvis not fixed.  Skin:    General: Skin is warm and dry.  Neurological:     Mental Status: She is alert and oriented to person, place, and time.  Psychiatric:        Mood and Affect: Mood and affect normal.        Behavior: Behavior normal.        Thought Content: Thought content normal.        Judgment: Judgment normal.     Wet Prep: Clue Cells: Negative Fungal elements: Negative Trichomonas: Negative   Assessment/Plan:     45 yo with abnormal vaginal bleeding Will follow up for pelvic US Repeat pap smear collected today, last pap smear was insufficent Nuswab sent for vaginitis screening.  More than 20 minutes were spent face to face with the patient in the room, reviewing the medical record, labs and images, and coordinating care for the patient. The plan of management was discussed in detail and counseling was provided.   Adrian Prows MD [facility], [facility] 07/30/2020 3:56 PM

## 2020-08-02 LAB — NUSWAB BV AND CANDIDA, NAA
Candida albicans, NAA: NEGATIVE
Candida glabrata, NAA: NEGATIVE

## 2020-08-03 LAB — IGP,CTNGTV,APT HPV,RFX16/18,45
Chlamydia, Nuc. Acid Amp: NEGATIVE
Gonococcus, Nuc. Acid Amp: NEGATIVE
HPV Aptima: NEGATIVE
Trich vag by NAA: NEGATIVE

## 2020-08-11 ENCOUNTER — Other Ambulatory Visit: Payer: Self-pay | Admitting: Obstetrics and Gynecology

## 2020-08-11 ENCOUNTER — Ambulatory Visit (INDEPENDENT_AMBULATORY_CARE_PROVIDER_SITE_OTHER): Payer: Managed Care, Other (non HMO) | Admitting: Obstetrics and Gynecology

## 2020-08-11 ENCOUNTER — Other Ambulatory Visit: Payer: Self-pay

## 2020-08-11 ENCOUNTER — Encounter: Payer: Self-pay | Admitting: Obstetrics and Gynecology

## 2020-08-11 ENCOUNTER — Ambulatory Visit (INDEPENDENT_AMBULATORY_CARE_PROVIDER_SITE_OTHER): Payer: Managed Care, Other (non HMO)

## 2020-08-11 VITALS — BP 118/70 | Ht 62.0 in | Wt 216.4 lb

## 2020-08-11 DIAGNOSIS — N939 Abnormal uterine and vaginal bleeding, unspecified: Secondary | ICD-10-CM | POA: Diagnosis not present

## 2020-08-11 DIAGNOSIS — N923 Ovulation bleeding: Secondary | ICD-10-CM

## 2020-08-11 DIAGNOSIS — N930 Postcoital and contact bleeding: Secondary | ICD-10-CM

## 2020-08-11 DIAGNOSIS — N920 Excessive and frequent menstruation with regular cycle: Secondary | ICD-10-CM | POA: Diagnosis not present

## 2020-08-11 NOTE — Progress Notes (Signed)
Patient ID: Sonia Phillips, female   DOB: 1975/11/22, 45 y.o.   MRN: [id number]  Reason for Consult: Gynecologic Exam   Referred by Ellene Route  Subjective:     HPI:  Sonia Phillips is a 45 y.o. female. Sonia Phillips is following up regarding irregular bleeding for a pelvic US today. Infection testing was negative. Sonia Phillips reports her bleeding has been better.   Past Medical History:  Diagnosis Date  . GERD (gastroesophageal reflux disease)   . History of mammogram 06/04/2012   BENIGN  . History of Papanicolaou smear of cervix 07/23/2014   -/-   Family History  Problem Relation Age of Onset  . Heart disease Father   . Hyperlipidemia Father   . Hypertension Father   . Healthy Mother   . Breast cancer Neg Hx    Past Surgical History:  Procedure Laterality Date  . CESAREAN SECTION  1997; 2004  . TUBAL LIGATION  2004   PJR  . UPPER GI ENDOSCOPY  11/22/2019  . WISDOM TOOTH EXTRACTION      Short Social History:  Social History   Tobacco Use  . Smoking status: Never Smoker  . Smokeless tobacco: Never Used  Substance Use Topics  . Alcohol use: No    No Known Allergies  Current Outpatient Medications  Medication Sig Dispense Refill  . Multiple Vitamin (MULTIVITAMIN WITH MINERALS) TABS tablet Take 1 tablet by mouth daily.    Marland Kitchen acetaminophen (TYLENOL) 325 MG tablet Take by mouth every 6 (six) hours as needed. (Patient not taking: Reported on 07/30/2020)    . phentermine (ADIPEX-P) 37.5 MG tablet Take 1 tablet (37.5 mg total) by mouth daily before breakfast. (Patient not taking: Reported on 07/30/2020) 30 tablet 1   No current facility-administered medications for this visit.    Review of Systems  Constitutional: Negative for chills, fatigue, fever and unexpected weight change.  HENT: Negative for trouble swallowing.  Eyes: Negative for loss of vision.  Respiratory: Negative for cough, shortness of breath and wheezing.  Cardiovascular: Negative for chest pain, leg  swelling, palpitations and syncope.  GI: Negative for abdominal pain, blood in stool, diarrhea, nausea and vomiting.  GU: Negative for difficulty urinating, dysuria, frequency and hematuria.  Musculoskeletal: Negative for back pain, leg pain and joint pain.  Skin: Negative for rash.  Neurological: Negative for dizziness, headaches, light-headedness, numbness and seizures.  Psychiatric: Negative for behavioral problem, confusion, depressed mood and sleep disturbance.        Objective:  Objective   Vitals:   08/11/20 1406  BP: 118/70  Weight: 216 lb 6.4 oz (98.2 kg)  Height: 5\' 2"  (1.575 m)   Body mass index is 39.58 kg/m.  Physical Exam Vitals and nursing note reviewed.  Constitutional:      Appearance: Sonia Phillips is well-developed and well-nourished.  HENT:     Head: Normocephalic and atraumatic.  Eyes:     Extraocular Movements: EOM normal.     Pupils: Pupils are equal, round, and reactive to light.  Cardiovascular:     Rate and Rhythm: Normal rate and regular rhythm.  Pulmonary:     Effort: Pulmonary effort is normal. No respiratory distress.  Skin:    General: Skin is warm and dry.  Neurological:     Mental Status: Sonia Phillips is alert and oriented to person, place, and time.  Psychiatric:        Mood and Affect: Mood and affect normal.        Behavior: Behavior normal.  Thought Content: Thought content normal.        Judgment: Judgment normal.     Assessment/Plan:     45 yo with AUB. Pelvic US today shows a small intramural fibroid. Endometrium is WNL. Sonia Phillips reports her bleeding has been better. Sonia Phillips will return if issue continues  More than 15 minutes were spent face to face with the patient in the room, reviewing the medical record, labs and images, and coordinating care for the patient. The plan of management was discussed in detail and counseling was provided.    Adrian Prows MD [facility], [facility] 08/17/2020 2:49 PM

## 2020-09-07 ENCOUNTER — Other Ambulatory Visit: Payer: Self-pay | Admitting: Advanced Practice Midwife

## 2020-09-07 DIAGNOSIS — E669 Obesity, unspecified: Secondary | ICD-10-CM

## 2021-06-18 IMAGING — MG DIGITAL SCREENING BILAT W/ TOMO W/ CAD
8 series · 8 of 24 positions shown · non-contrast
Comparison: Previous exam(s).

CLINICAL DATA: Screening.

EXAM:
DIGITAL SCREENING BILATERAL MAMMOGRAM WITH TOMO AND CAD

[R CC synth-2D]
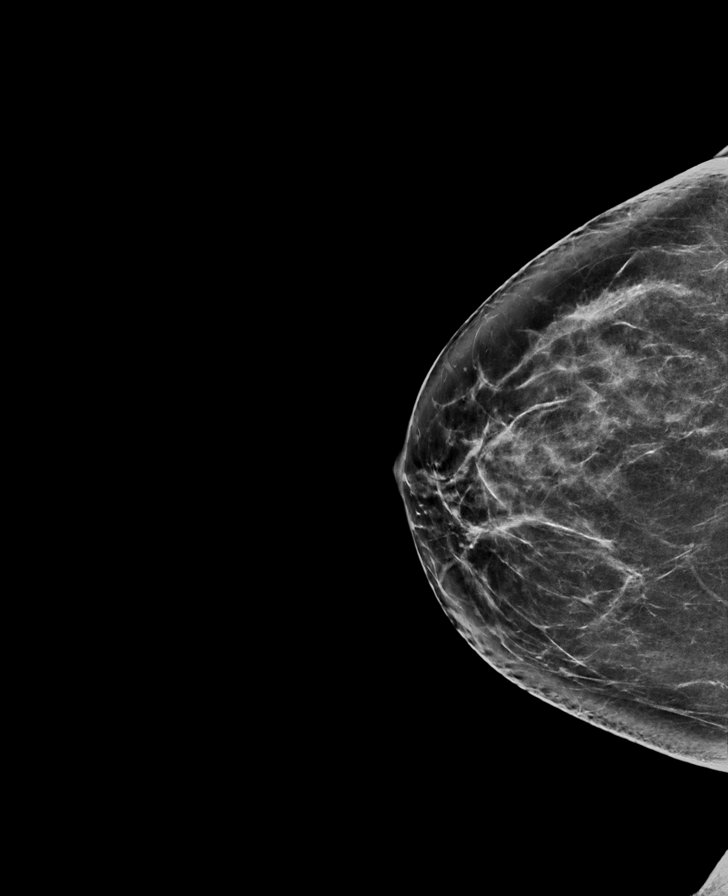

[L CC synth-2D]
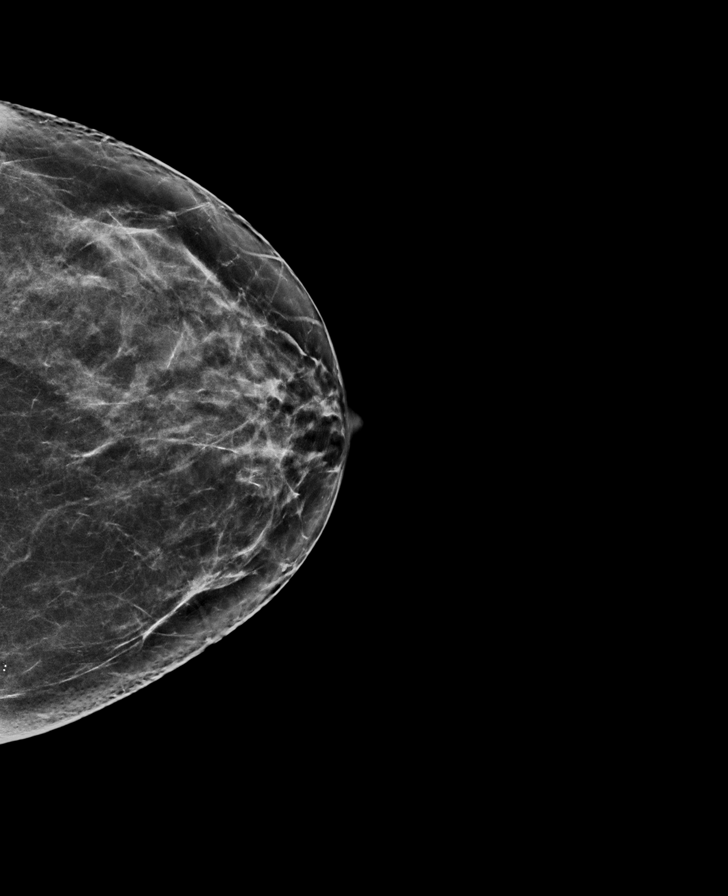

[L MLO synth-2D]
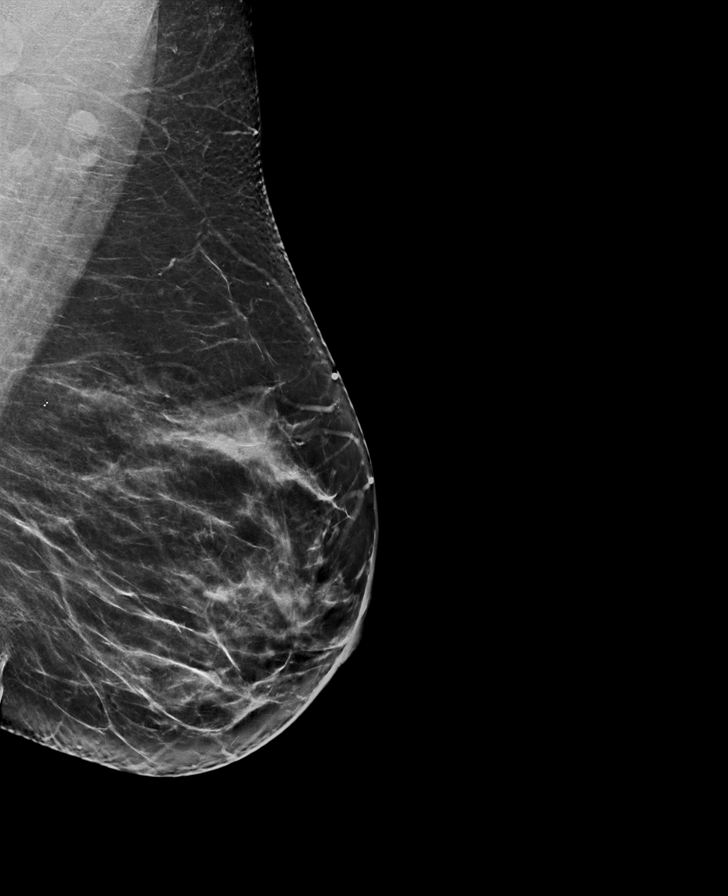

[R MLO synth-2D]
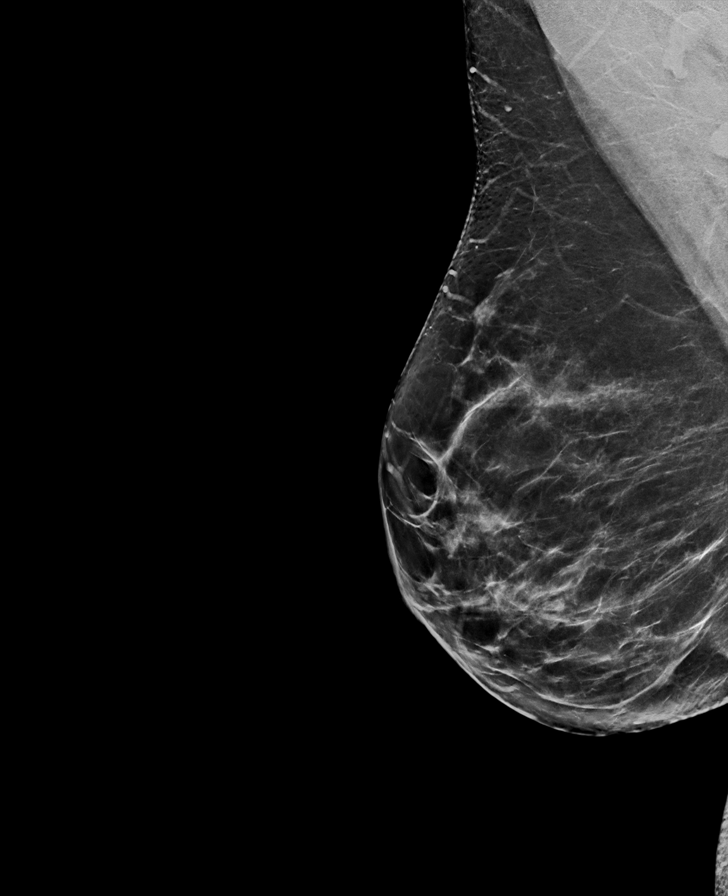

[R MLO tomo · tomo slice 41/80.0]
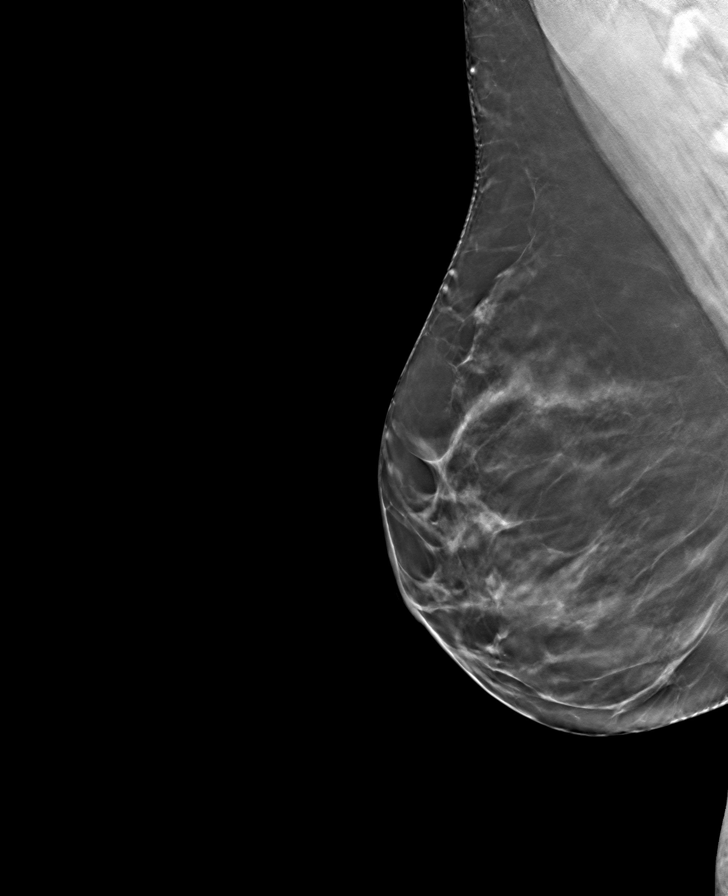

[L MLO tomo · tomo slice 43/84.0]
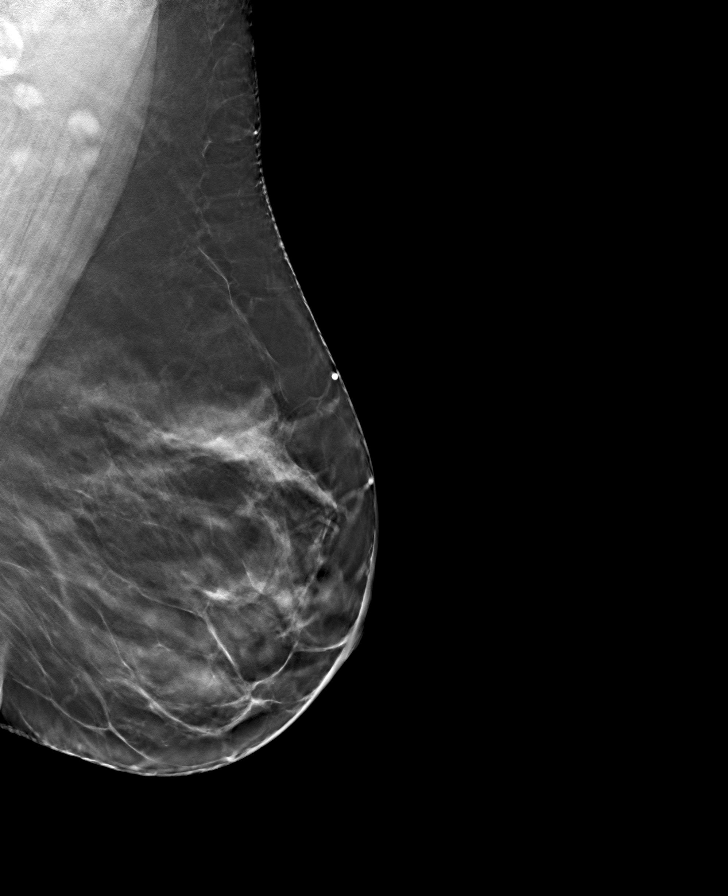

[L CC tomo · tomo slice 37/72.0]
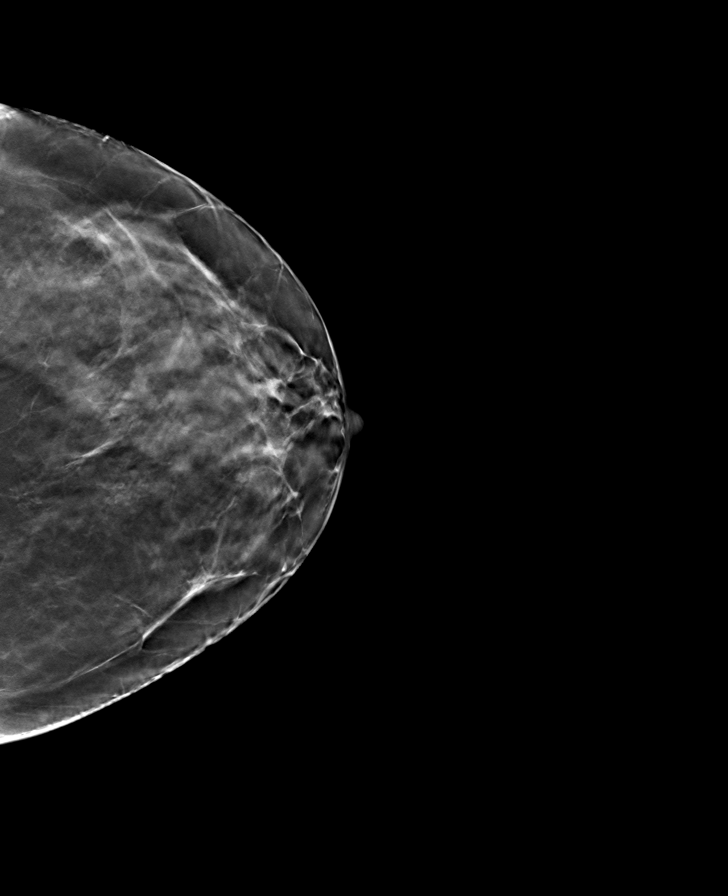

[R CC tomo · tomo slice 39/78.0]
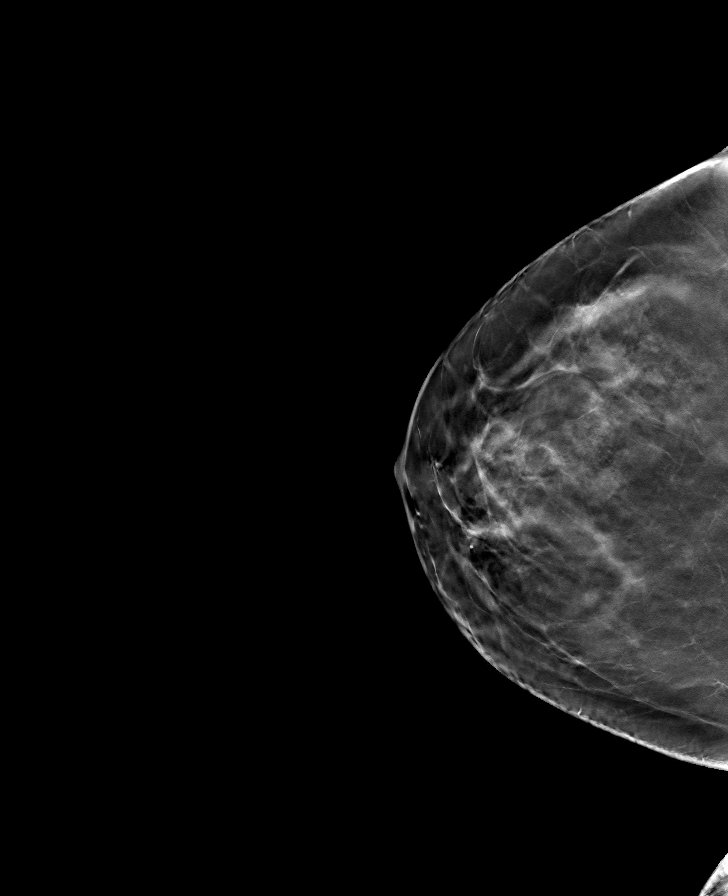

[8 of 24 positions shown; findings below may reference images not displayed]

ACR Breast Density Category c: The breast tissue is heterogeneously
dense, which may obscure small masses.
FINDINGS: There are no findings suspicious for malignancy. Images were
processed with CAD.
IMPRESSION: No mammographic evidence of malignancy. A result letter of this
screening mammogram will be mailed directly to the patient.

RECOMMENDATION:
Screening mammogram in one year. (Code:FT-U-LHB)

BI-RADS CATEGORY  1: Negative.

## 2021-08-27 ENCOUNTER — Emergency Department: Payer: Managed Care, Other (non HMO)

## 2021-08-27 ENCOUNTER — Emergency Department
Admission: EM | Admit: 2021-08-27 | Discharge: 2021-08-27 | Disposition: A | Discharge status: Home / Self Care | Payer: Managed Care, Other (non HMO) | Attending: Emergency Medicine | Admitting: Emergency Medicine

## 2021-08-27 ENCOUNTER — Other Ambulatory Visit: Payer: Self-pay

## 2021-08-27 DIAGNOSIS — R1012 Left upper quadrant pain: Secondary | ICD-10-CM | POA: Insufficient documentation

## 2021-08-27 DIAGNOSIS — R000 Tachycardia, unspecified: Secondary | ICD-10-CM | POA: Diagnosis not present

## 2021-08-27 DIAGNOSIS — R112 Nausea with vomiting, unspecified: Secondary | ICD-10-CM | POA: Diagnosis not present

## 2021-08-27 DIAGNOSIS — R0600 Dyspnea, unspecified: Secondary | ICD-10-CM

## 2021-08-27 LAB — POC URINE PREG, ED: Preg Test, Ur: NEGATIVE

## 2021-08-27 LAB — URINALYSIS, ROUTINE W REFLEX MICROSCOPIC
Bilirubin Urine: NEGATIVE
Glucose, UA: NEGATIVE mg/dL
Hgb urine dipstick: NEGATIVE
Ketones, ur: NEGATIVE mg/dL
Leukocytes,Ua: NEGATIVE
Nitrite: NEGATIVE
Protein, ur: NEGATIVE mg/dL
Specific Gravity, Urine: 1.026 (ref 1.005–1.030)
pH: 5 (ref 5.0–8.0)

## 2021-08-27 LAB — CBC
HCT: 41.6 % (ref 36.0–46.0)
Hemoglobin: 13.2 g/dL (ref 12.0–15.0)
MCH: 27.1 pg (ref 26.0–34.0)
MCHC: 31.7 g/dL (ref 30.0–36.0)
MCV: 85.4 fL (ref 80.0–100.0)
Platelets: 340 10*3/uL (ref 150–400)
RBC: 4.87 MIL/uL (ref 3.87–5.11)
RDW: 13.6 % (ref 11.5–15.5)
WBC: 9.4 10*3/uL (ref 4.0–10.5)
nRBC: 0 % (ref 0.0–0.2)

## 2021-08-27 LAB — COMPREHENSIVE METABOLIC PANEL
ALT: 15 U/L (ref 0–44)
AST: 17 U/L (ref 15–41)
Albumin: 4 g/dL (ref 3.5–5.0)
Alkaline Phosphatase: 53 U/L (ref 38–126)
Anion gap: 3 — ABNORMAL LOW (ref 5–15)
BUN: 18 mg/dL (ref 6–20)
CO2: 24 mmol/L (ref 22–32)
Calcium: 8.2 mg/dL — ABNORMAL LOW (ref 8.9–10.3)
Chloride: 108 mmol/L (ref 98–111)
Creatinine, Ser: 0.75 mg/dL (ref 0.44–1.00)
GFR, Estimated: >60 mL/min (ref >60)
Glucose, Bld: 128 mg/dL — ABNORMAL HIGH (ref 70–99)
Potassium: 3.8 mmol/L (ref 3.5–5.1)
Sodium: 135 mmol/L (ref 135–145)
Total Bilirubin: 0.6 mg/dL (ref 0.3–1.2)
Total Protein: 7.6 g/dL (ref 6.5–8.1)

## 2021-08-27 LAB — TROPONIN I (HIGH SENSITIVITY): Troponin I (High Sensitivity): <2 ng/L (ref <18)

## 2021-08-27 LAB — LIPASE, BLOOD: Lipase: 44 U/L (ref 11–51)

## 2021-08-27 MED ORDER — LACTATED RINGERS IV BOLUS
2000.0000 mL | Freq: Once | INTRAVENOUS | Status: AC
  Administered 2021-08-27: 2000 mL via INTRAVENOUS

## 2021-08-27 MED ORDER — DICYCLOMINE HCL 20 MG PO TABS: 20.0000 mg | ORAL_TABLET | Freq: Three times a day (TID) | ORAL | 0 refills | Status: AC | PRN

## 2021-08-27 MED ORDER — ONDANSETRON 4 MG PO TBDP: 4.0000 mg | ORAL_TABLET | Freq: Four times a day (QID) | ORAL | 0 refills | Status: DC | PRN

## 2021-08-27 MED ORDER — KETOROLAC TROMETHAMINE 30 MG/ML IJ SOLN
30.0000 mg | Freq: Once | INTRAMUSCULAR | Status: AC
  Administered 2021-08-27: 30 mg via INTRAVENOUS
  Filled 2021-08-27: qty 1

## 2021-08-27 MED ORDER — ONDANSETRON HCL 4 MG/2ML IJ SOLN
4.0000 mg | Freq: Once | INTRAMUSCULAR | Status: AC
  Administered 2021-08-27: 4 mg via INTRAVENOUS
  Filled 2021-08-27: qty 2

## 2021-08-27 NOTE — ED Notes (Signed)
Provider in to see pt. States only needs 1L IVF. Will d/c 2nd liter.  ?

## 2021-08-27 NOTE — ED Provider Notes (Signed)
[facility] Provider Note    Event Date/Time   First MD Initiated Contact with Patient 08/27/21 [phone]     (approximate)   History   Emesis   HPI  Sonia Phillips is a 46 y.o. female with history of GERD who presents to the emergency department nausea and vomiting that started tonight.  States she vomited multiple times and at the end of vomiting she would notice streaks of blood in her vomit.  No diarrhea.  No fever.  Is having some left upper abdominal soreness.  No sick contacts, recent travel, antibiotic use or hospitalization.  States she did start to feel short of breath earlier but this has resolved.  No chest pain or chest discomfort.  She has had previous C-sections, BTL but no other abdominal surgery.   No history of PE, DVT, exogenous estrogen use, recent fractures, surgery, trauma, hospitalization, prolonged travel or other immobilization. No lower extremity swelling or pain. No calf tenderness.   History provided by patient.    Past Medical History:  Diagnosis Date   GERD (gastroesophageal reflux disease)    History of mammogram 06/04/2012   BENIGN   History of Papanicolaou smear of cervix 07/23/2014   -/-    Past Surgical History:  Procedure Laterality Date   CESAREAN SECTION  1997; 2004   TUBAL LIGATION  2004   PJR   UPPER GI ENDOSCOPY  11/22/2019   WISDOM TOOTH EXTRACTION      MEDICATIONS:  Prior to Admission medications   Medication Sig Start Date End Date Taking? Authorizing Provider  acetaminophen (TYLENOL) 325 MG tablet Take by mouth every 6 (six) hours as needed. Patient not taking: Reported on 07/30/2020    [provider]  Multiple Vitamin (MULTIVITAMIN WITH MINERALS) TABS tablet Take 1 tablet by mouth daily.    [provider]  phentermine (ADIPEX-P) 37.5 MG tablet Take 1 tablet (37.5 mg total) by mouth daily before breakfast. Patient not taking: Reported on 07/30/2020 02/26/20   Rod Can, CNM     Physical Exam   Triage Vital Signs: ED Triage Vitals  Enc Vitals Group     BP 08/27/21 0315 116/81     Pulse Rate 08/27/21 0315 (!) 131     Resp 08/27/21 0315 20     Temp 08/27/21 0315 98.9 F (37.2 C)     Temp Source 08/27/21 0315 Oral     SpO2 08/27/21 0315 97 %     Weight 08/27/21 0316 200 lb (90.7 kg)     Height 08/27/21 0316 5\' 2"  (1.575 m)     Head Circumference --      Peak Flow --      Pain Score 08/27/21 0316 0     Pain Loc --      Pain Edu? --      Excl. in [place]? --     Most recent vital signs: Vitals:   08/27/21 0545 08/27/21 0600  BP:  114/72  Pulse: 97 92  Resp: 20 14  Temp:    SpO2: 97% 97%    CONSTITUTIONAL: Alert and oriented and responds appropriately to questions. Well-appearing; well-nourished HEAD: Normocephalic, atraumatic EYES: Conjunctivae clear, pupils appear equal, sclera nonicteric ENT: normal nose; moist mucous membranes NECK: Supple, normal ROM CARD: RRR; S1 and S2 appreciated; no murmurs, no clicks, no rubs, no gallops RESP: Normal chest excursion without splinting or tachypnea; breath sounds clear and equal bilaterally; no wheezes, no rhonchi, no rales, no hypoxia or respiratory  distress, speaking full sentences ABD/GI: Normal bowel sounds; non-distended; soft, non-tender, no rebound, no guarding, no peritoneal signs BACK: The back appears normal EXT: Normal ROM in all joints; no deformity noted, no edema; no cyanosis SKIN: Normal color for age and race; warm; no rash on exposed skin NEURO: Moves all extremities equally, normal speech PSYCH: The patient's mood and manner are appropriate.   ED Results / Procedures / Treatments   LABS: (all labs ordered are listed, but only abnormal results are displayed) Labs Reviewed  COMPREHENSIVE METABOLIC PANEL - Abnormal; Notable for the following components:      Result Value   Glucose, Bld 128 (*)    Calcium 8.2 (*)    Anion gap 3 (*)    All other components within normal limits   URINALYSIS, ROUTINE W REFLEX MICROSCOPIC - Abnormal; Notable for the following components:   Color, Urine YELLOW (*)    APPearance HAZY (*)    All other components within normal limits  LIPASE, BLOOD  CBC  POC URINE PREG, ED  TROPONIN I (HIGH SENSITIVITY)     EKG:  EKG Interpretation  Date/Time:  Friday August 27 2021 03:19:39 EST Ventricular Rate:  120 PR Interval:  134 QRS Duration: 84 QT Interval:  318 QTC Calculation: 449 R Axis:   32 Text Interpretation: Sinus tachycardia Cannot rule out Anterior infarct (cited on or before 18-Nov-2019) Abnormal ECG When compared with ECG of 18-Nov-2019 09:42, No significant change was found Confirmed by Pryor Curia [phone]) on 08/27/2021 4:59:25 AM         RADIOLOGY: My personal review and interpretation of imaging: Chest x-ray clear.  I have personally reviewed all radiology reports.   DG Chest 2 View  Result Date: 08/27/2021 CLINICAL DATA:  Shortness of breath EXAM: CHEST - 2 VIEW COMPARISON:  None. FINDINGS: The heart size and mediastinal contours are within normal limits. Both lungs are clear. The visualized skeletal structures are unremarkable. IMPRESSION: No active cardiopulmonary disease. Electronically Signed   By: Ulyses Jarred M.D.   On: 08/27/2021 03:50     PROCEDURES:  Critical Care performed: No   CRITICAL CARE Performed by: Cyril Mourning Abigaelle Verley   Total critical care time: 0 minutes  Critical care time was exclusive of separately billable procedures and treating other patients.  Critical care was necessary to treat or prevent imminent or life-threatening deterioration.  Critical care was time spent personally by me on the following activities: development of treatment plan with patient and/or surrogate as well as nursing, discussions with consultants, evaluation of patient's response to treatment, examination of patient, obtaining history from patient or surrogate, ordering and performing treatments and interventions,  ordering and review of laboratory studies, ordering and review of radiographic studies, pulse oximetry and re-evaluation of patient's condition.   Marland Kitchen1-3 Lead EKG Interpretation Performed by: Monserratt Knezevic, Delice Bison, DO Authorized by: Samary Shatz, Delice Bison, DO     Interpretation: normal     ECG rate:  95   ECG rate assessment: normal     Rhythm: sinus rhythm     Ectopy: none     Conduction: normal      IMPRESSION / MDM / ASSESSMENT AND PLAN / ED COURSE  I reviewed the triage vital signs and the nursing notes.    Patient here with nausea and vomiting and then subsequent shortness of breath and tachycardia.  The patient is on the cardiac monitor to evaluate for evidence of arrhythmia and/or significant heart rate changes.   DIFFERENTIAL DIAGNOSIS (includes but not  limited to):   Viral gastroenteritis, gastritis, less likely appendicitis, colitis, diverticulitis, bowel obstruction, perforation.  Doubt ACS, PE, dissection.  Suspect dehydration.   PLAN: We will obtain CBC, CMP, lipase, urinalysis.  EKG shows sinus tachycardia without ischemic change.  Heart rate has improved and she is no longer feeling short of breath.  She has no risk factors for PE.  Doubt ACS.  Will give IV fluids, Zofran.  Abdominal exam is benign.  We will hold off any abdominal imaging.  Will obtain chest x-ray.   MEDICATIONS GIVEN IN ED: Medications  lactated ringers bolus 2,000 mL (2,000 mLs Intravenous New Bag/Given 08/27/21 0530)  ondansetron (ZOFRAN) injection 4 mg (4 mg Intravenous Given 08/27/21 0530)  ketorolac (TORADOL) 30 MG/ML injection 30 mg (30 mg Intravenous Given 08/27/21 0530)     ED COURSE: Labs show no leukocytosis.  Normal hemoglobin.  Normal electrolytes, LFTs, renal function, lipase.  Urine shows no sign of infection or dehydration.  Pregnancy test negative.  Troponin is negative.  Chest x-ray reviewed by myself and radiologist and shows no infiltrate, edema or pneumothorax.  EKG is nonischemic.   Patient  reports feeling much better.  Abdominal exam continues to be benign.  She has been able to tolerate p.o.  I feel she is safe to be discharged home with prescriptions for Zofran, Bentyl.  Suspect viral illness causing her symptoms.  Discussed return precautions.   At this time, I do not feel there is any life-threatening condition present. I reviewed all nursing notes, vitals, pertinent previous records.  All lab and urine results, EKGs, imaging ordered have been independently reviewed and interpreted by myself.  I reviewed all available radiology reports from any imaging ordered this visit.  Based on my assessment, I feel the patient is safe to be discharged home without further emergent workup and can continue workup as an outpatient as needed. Discussed all findings, treatment plan as well as usual and customary return precautions with patient.  They verbalize understanding and are comfortable with this plan.  Outpatient follow-up has been provided as needed.  All questions have been answered.   CONSULTS: No admission needed at this time given patient's work-up has been reassuring and she is feeling better, heart rate has improved and her abdominal exam is benign.   OUTSIDE RECORDS REVIEWED: Reviewed last office visit with Derinda Late on 07/27/2021.         FINAL CLINICAL IMPRESSION(S) / ED DIAGNOSES   Final diagnoses:  Nausea and vomiting in adult  Dyspnea, unspecified type     Rx / DC Orders   ED Discharge Orders          Ordered    ondansetron (ZOFRAN-ODT) 4 MG disintegrating tablet  Every 6 hours PRN        08/27/21 0621    dicyclomine (BENTYL) 20 MG tablet  Every 8 hours PRN        08/27/21 6203             Note:  This document was prepared using Dragon voice recognition software and may include unintentional dictation errors.   Joziyah Roblero, Delice Bison, DO 08/27/21 [phone]

## 2021-08-27 NOTE — ED Triage Notes (Signed)
Pt presents to ER from home c/o n/v and some sob that started around 2300 yesterday.  Pt denies diarrhea or abd pain.  Pt denies eating any new foods.  Pt states she has had around 5 episodes of vomiting since it started.  Pt states she felt like her heart was racing at home and she was having sob which is why she decided to be seen tonight.  Pt A&O x4 at this time.   ?

## 2021-08-27 NOTE — Discharge Instructions (Addendum)
You may alternate Tylenol 1000 mg every 6 hours as needed for pain, fever and Ibuprofen 800 mg every 6-8 hours as needed for pain, fever.  Please take Ibuprofen with food.  Do not take more than 4000 mg of Tylenol (acetaminophen) in a 24 hour period. ? ?If you begin having diarrhea, you may use over-the-counter Imodium as needed. ? ?Your labs, urine, EKG, chest x-ray were reassuring today. ? ?I recommend a bland diet for the next several days. ? ?If you develop chest tightness, return of your shortness of breath, feel lightheaded like you might pass out, have severe abdominal pain especially in the right lower abdomen, vomiting that does not stop or blood in your stool or black tarry stools, please return to the emergency department. ?

## 2021-09-15 ENCOUNTER — Other Ambulatory Visit: Payer: Self-pay | Admitting: Student

## 2021-09-15 DIAGNOSIS — Z1231 Encounter for screening mammogram for malignant neoplasm of breast: Secondary | ICD-10-CM

## 2022-08-04 ENCOUNTER — Ambulatory Visit
Admission: RE | Admit: 2022-08-04 | Discharge: 2022-08-04 | Disposition: A | Discharge status: Home / Self Care | Payer: Managed Care, Other (non HMO) | Source: Ambulatory Visit | Attending: Student | Admitting: Student

## 2022-08-04 DIAGNOSIS — Z1231 Encounter for screening mammogram for malignant neoplasm of breast: Secondary | ICD-10-CM | POA: Insufficient documentation

## 2022-08-10 ENCOUNTER — Ambulatory Visit: Payer: Managed Care, Other (non HMO) | Admitting: Licensed Practical Nurse

## 2022-08-31 ENCOUNTER — Ambulatory Visit (INDEPENDENT_AMBULATORY_CARE_PROVIDER_SITE_OTHER): Payer: Managed Care, Other (non HMO) | Admitting: Licensed Practical Nurse

## 2022-08-31 VITALS — BP 106/71 | HR 88 | Ht 62.0 in | Wt 167.6 lb

## 2022-08-31 DIAGNOSIS — Z30011 Encounter for initial prescription of contraceptive pills: Secondary | ICD-10-CM

## 2022-08-31 DIAGNOSIS — D219 Benign neoplasm of connective and other soft tissue, unspecified: Secondary | ICD-10-CM

## 2022-08-31 DIAGNOSIS — Z01419 Encounter for gynecological examination (general) (routine) without abnormal findings: Secondary | ICD-10-CM

## 2022-08-31 MED ORDER — DROSPIRENONE-ETHINYL ESTRADIOL 3-0.02 MG PO TABS: 1.0000 | ORAL_TABLET | Freq: Every day | ORAL | 11 refills | Status: DC

## 2022-08-31 NOTE — Progress Notes (Signed)
Gynecology Annual Exam  PCP: Ellene Route  Chief Complaint:  Chief Complaint  Patient presents with   Gynecologic Exam    History of Present Illness: Patient is a 47 y.o. G2P2002 presents for annual exam. The patient would like a prescription so that she can skip her period next month, they are going on vacation during the time she expects her cycle, her cycles are heavy so would prefer not to deal with it on vacation. She does not use tobacco, no hx of CHTN, denies hx of HTN or migraines with aura.   -wonders about menopause, she doe snot have any sxs, her mother went through menopause around age 80.   LMP: Patient's last menstrual period was 08/11/2022 (exact date). Average Interval: regular, monthly, they always come on the 15th of the month.  Duration of flow: 3 days Heavy Menses: yes, x3 days, changes products every 45 mins to 1 hour Clots: yes Intermenstrual Bleeding: no Postcoital Bleeding: no Dysmenorrhea: occasional   The patient is sexually active with one female partner. She currently uses tubal ligation for contraception. She denies dyspareunia.  The patient does perform self breast exams.  There is no notable family history of breast or ovarian cancer in her family.  The patient wears seatbelts: yes.   The patient has regular exercise: yes.  walking  The patient denies current symptoms of depression.  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 Tylenol or  Motrin takes the pain away. She has not been evaluated for endometriosis, denies hx hernia.   Past Medical History:  There are no problems to display for this patient.   Past Surgical History:  Past Surgical History:  Procedure Laterality Date   CESAREAN SECTION  1997; 2004   TUBAL LIGATION  2004   [facility]   UPPER GI ENDOSCOPY  11/22/2019   WISDOM TOOTH EXTRACTION      Gynecologic History:  Patient's last menstrual period was 08/11/2022 (exact date). Contraception: tubal ligation Last Pap: Results were: 2022 no abnormalities  Last mammogram: 08/04/2022 Results were: BI-RAD I  Obstetric [id number]  Family History:  Family History  Problem Relation Age of Onset   Heart disease Father    Hyperlipidemia Father    Hypertension Father    Healthy Mother    Breast cancer Neg Hx     Social History:  Social History   Socioeconomic History   Marital status: Married    Spouse name: Not on file   Number of children: 2   Years of education: 12   Highest education level: Not on file  Occupational History   Occupation: [employer]  Tobacco Use   Smoking status: Never   Smokeless tobacco: Never  Vaping Use   Vaping Use: Never used  Substance and Sexual Activity   Alcohol use: No   Drug use: No   Sexual activity: Yes    Birth control/protection: None, Surgical  Comment: Tubal Ligation  Other Topics Concern   Not on file  Social History Narrative   Not on file   Social Determinants of Health   Financial Resource Strain: Not on file  Food Insecurity: Not on file  Transportation Needs: Not on file  Physical Activity: Sufficiently Active (06/13/2017)   Exercise Vital Sign    Days of Exercise per Week: 4 days    Minutes of Exercise per Session: 40 min  Stress: No Stress Concern Present (06/13/2017)   [place]    Feeling of Stress : Only a little  Social Connections: Socially Integrated (06/13/2017)    Social Connection and Isolation Panel [NHANES]    Frequency of Communication with Friends and Family: More than three times a week    Frequency of Social Gatherings with Friends and Family: Three times a week    Attends Religious Services: More than 4 times per year    Active Member of Clubs or Organizations: Yes    Attends Archivist Meetings: More than 4 times per year    Marital Status: Married  Human resources officer Violence: Not At Risk (06/13/2017)   Humiliation, Afraid, Rape, and Kick questionnaire    Fear of Current or Ex-Partner: No    Emotionally Abused: No    Physically Abused: No    Sexually Abused: No    Allergies:  No Known Allergies  Medications: Prior to Admission medications   Medication Sig Start Date End Date Taking? Authorizing Provider  dicyclomine (BENTYL) 20 MG tablet Take 1 tablet (20 mg total) by mouth every 8 (eight) hours as needed for spasms (Abdominal cramping). 08/27/21  Yes Ward, Delice Bison, DO  drospirenone-ethinyl estradiol (YAZ) 3-0.02 MG tablet Take 1 tablet by mouth daily. 08/31/22  Yes Manami Tutor, Nunzio Cobbs, CNM  Multiple Vitamin (MULTIVITAMIN WITH MINERALS) TABS tablet Take 1 tablet by mouth daily.   Yes [provider]  acetaminophen (TYLENOL) 325 MG tablet Take by mouth every 6 (six) hours as needed. Patient not taking: Reported on 07/30/2020    [provider]  ondansetron (ZOFRAN-ODT) 4 MG disintegrating tablet Take 1 tablet (4 mg total) by mouth every 6 (six) hours as needed for nausea or vomiting. Patient not taking: Reported on 08/31/2022 08/27/21   Ward, Delice Bison, DO  phentermine (ADIPEX-P) 37.5 MG tablet Take 1 tablet (37.5 mg total) by mouth daily before breakfast. Patient not taking: Reported on 07/30/2020 02/26/20   Rod Can, CNM    Physical Exam Vitals: Blood pressure 106/71, pulse 88, height '5\' 2"'$  (1.575 m), weight 167 lb 9.6 oz (76 kg), last menstrual period 08/11/2022.  General: NAD HEENT: normocephalic,  anicteric Thyroid: no enlargement, no palpable nodules Pulmonary: No increased work of breathing, CTAB Cardiovascular: RRR, distal pulses 2+ Breast: Breast symmetrical, no tenderness, no palpable nodules or masses, no skin or nipple retraction present, no nipple discharge.  No axillary or supraclavicular lymphadenopathy. Abdomen: NABS, soft, tender above the right side of her c-s scar and just below the umbilicus, non-distended.  Umbilicus without lesions.  No hepatomegaly, splenomegaly or masses palpable. No evidence of hernia  Genitourinary:  External: Normal external female genitalia.  Normal urethral meatus, normal Bartholin's and Skene's glands.    Vagina: Normal vaginal mucosa, no evidence of prolapse.  Good tone  Cervix: Grossly normal in appearance, no bleeding  Uterus: slightly larger than expected, mobile, normal contour.  No CMT  Adnexa: ovaries non-enlarged, no adnexal masses  Rectal: deferred  Lymphatic:  no evidence of inguinal lymphadenopathy Extremities: no edema, erythema, or tenderness Neurologic: Grossly intact Psychiatric: mood appropriate, affect full   Assessment: 47 y.o. G2P2002 routine annual exam  Plan: Problem List Items Addressed This Visit   None Visit Diagnoses     Well woman exam    -  Primary   Relevant Medications   drospirenone-ethinyl estradiol (YAZ) 3-0.02 MG tablet   Encounter for initial prescription of contraceptive pills       Relevant Medications   drospirenone-ethinyl estradiol (YAZ) 3-0.02 MG tablet       1) Mammogram - recommend yearly screening mammogram.  Mammogram Is up to date   2) STI screening  wasoffered and declined  3) ASCCP guidelines and rational discussed.  Patient opts for every 3 years screening interval due 2025  4) Contraception - the patient is currently using  none.  She is happy with her current form of contraception and plans to continue -Script for [facility] for therapeutic amenorrhea  sent to pharmacy, reviewed  increased risk of VTE d/t age.   5) Colonoscopy -- Screening recommended starting at age 20 for average risk individuals, age 64 for individuals deemed at increased risk (including African Americans) and recommended to continue until age 63.  For patient age 37-85 individualized approach is recommended.  Gold standard screening is via colonoscopy, Cologuard screening is an acceptable alternative for patient unwilling or unable to undergo colonoscopy.  "Colorectal cancer screening for average?risk adults: 2018 guideline update from the [place]: A Cancer Journal for Clinicians: Nov 23, 2016 Scheduled in May   6) Routine healthcare maintenance including cholesterol, diabetes screening discussed managed by PCP  7) Periumbilical/ right sided pain  -castor oil packs reviewed -Pelvic US ordered   8) Heavy menstrual cycles -reviewed treatment options, will do Pelvic US, fu with MD if needed [place].    Roberto Scales, [facility], [facility] 08/31/2022, 9:24 AM

## 2022-09-20 ENCOUNTER — Other Ambulatory Visit: Payer: Managed Care, Other (non HMO)

## 2022-09-28 ENCOUNTER — Other Ambulatory Visit: Payer: Managed Care, Other (non HMO)

## 2022-10-25 ENCOUNTER — Ambulatory Visit
Admission: RE | Admit: 2022-10-25 | Discharge: 2022-10-25 | Disposition: A | Discharge status: Home / Self Care | Payer: Managed Care, Other (non HMO) | Source: Ambulatory Visit | Attending: Licensed Practical Nurse | Admitting: Licensed Practical Nurse

## 2022-10-25 DIAGNOSIS — D219 Benign neoplasm of connective and other soft tissue, unspecified: Secondary | ICD-10-CM

## 2022-10-25 DIAGNOSIS — Z01419 Encounter for gynecological examination (general) (routine) without abnormal findings: Secondary | ICD-10-CM

## 2022-11-03 ENCOUNTER — Encounter: Payer: Self-pay | Admitting: Licensed Practical Nurse

## 2023-03-21 ENCOUNTER — Encounter: Payer: Self-pay | Admitting: Obstetrics and Gynecology

## 2023-03-21 ENCOUNTER — Ambulatory Visit (INDEPENDENT_AMBULATORY_CARE_PROVIDER_SITE_OTHER): Payer: Managed Care, Other (non HMO) | Admitting: Obstetrics and Gynecology

## 2023-03-21 VITALS — BP 107/74 | HR 78 | Ht 62.0 in | Wt 170.5 lb

## 2023-03-21 DIAGNOSIS — N840 Polyp of corpus uteri: Secondary | ICD-10-CM | POA: Diagnosis not present

## 2023-03-21 NOTE — Progress Notes (Signed)
Patient presents today to review an ultrasound done in April for pelvic pain. She reports regularly timed cycle that are heavy and have moderate cramping.

## 2023-03-21 NOTE — Progress Notes (Signed)
HPI:      [name] is a 47 y.o. [id number] who LMP was Patient's last menstrual period was 03/20/2023 (exact date).  Subjective:   She presents today because she continues to have heavy bleeding each month and her cramping has increased.  In April she was found to have what appears to be an endometrial polyp by ultrasound.  This is her first follow-up for it. She has a tubal ligation for birth control.    Hx: The following portions of the patient's history were reviewed and updated as appropriate:             She  has a past medical history of GERD (gastroesophageal reflux disease), History of mammogram (06/04/2012), and History of Papanicolaou smear of cervix (07/23/2014). She does not have a problem list on file. She  has a past surgical history that includes Cesarean section (1997; 2004); Tubal ligation (2004); Wisdom tooth extraction; and Upper gi endoscopy (11/22/2019). Her family history includes Healthy in her mother; Heart disease in her father; Hyperlipidemia in her father; Hypertension in her father. She  reports that she has never smoked. She has never used smokeless tobacco. She reports that she does not drink alcohol and does not use drugs. She has a current medication list which includes the following prescription(s): multivitamin with minerals, dicyclomine, and phentermine. She has No Known Allergies.       Review of Systems:  Review of Systems  Constitutional: Denied constitutional symptoms, night sweats, recent illness, fatigue, fever, insomnia and weight loss.  Eyes: Denied eye symptoms, eye pain, photophobia, vision change and visual disturbance.  Ears/Nose/Throat/Neck: Denied ear, nose, throat or neck symptoms, hearing loss, nasal discharge, sinus congestion and sore throat.  Cardiovascular: Denied cardiovascular symptoms, arrhythmia, chest pain/pressure, edema, exercise intolerance, orthopnea and palpitations.  Respiratory: Denied pulmonary symptoms, asthma,  pleuritic pain, productive sputum, cough, dyspnea and wheezing.  Gastrointestinal: Denied, gastro-esophageal reflux, melena, nausea and vomiting.  Genitourinary: See HPI for additional information.  Musculoskeletal: Denied musculoskeletal symptoms, stiffness, swelling, muscle weakness and myalgia.  Dermatologic: Denied dermatology symptoms, rash and scar.  Neurologic: Denied neurology symptoms, dizziness, headache, neck pain and syncope.  Psychiatric: Denied psychiatric symptoms, anxiety and depression.  Endocrine: Denied endocrine symptoms including hot flashes and night sweats.   Meds:   Current Outpatient Medications on File Prior to Visit  Medication Sig Dispense Refill   Multiple Vitamin (MULTIVITAMIN WITH MINERALS) TABS tablet Take 1 tablet by mouth daily.     dicyclomine (BENTYL) 20 MG tablet Take 1 tablet (20 mg total) by mouth every 8 (eight) hours as needed for spasms (Abdominal cramping). (Patient not taking: Reported on 03/21/2023) 15 tablet 0   phentermine (ADIPEX-P) 37.5 MG tablet Take 1 tablet (37.5 mg total) by mouth daily before breakfast. (Patient not taking: Reported on 07/30/2020) 30 tablet 1   No current facility-administered medications on file prior to visit.      Objective:     Vitals:   03/21/23 1451  BP: 107/74  Pulse: 78   Filed Weights   03/21/23 1451  Weight: 170 lb 8 oz (77.3 kg)              Ultrasound reviewed-possible endometrial polyp          Assessment:    G2P2002 There are no problems to display for this patient.    1. Endometrial polyp        Plan:            1.  Follow-up ultrasound for confirmation of presence of polyp.  If polyp still present likely do a preop for D&C.  2.  If polyp no longer present, consider cycle control methods specifically Mirena IUD for decreasing menstrual bleeding and cramping.  Have discussed rationale for D&C and cycle control in detail.  Endometrial polyps discussed in detail -usual benign nature  discussed.  Endometrial biopsy also discussed.  All questions answered. Orders No orders of the defined types were placed in this encounter.   No orders of the defined types were placed in this encounter.     F/U  Return for We will contact her with any abnormal test results. I spent 25 minutes involved in the care of this patient preparing to see the patient by obtaining and reviewing her medical history (including labs, imaging tests and prior procedures), documenting clinical information in the electronic health record (EHR), counseling and coordinating care plans, writing and sending prescriptions, ordering tests or procedures and in direct communicating with the patient and medical staff discussing pertinent items from her history and physical exam.  Elonda Husky, M.D. 03/21/2023 3:41 PM

## 2023-04-03 ENCOUNTER — Ambulatory Visit (INDEPENDENT_AMBULATORY_CARE_PROVIDER_SITE_OTHER): Payer: Managed Care, Other (non HMO)

## 2023-04-03 ENCOUNTER — Other Ambulatory Visit: Payer: Self-pay | Admitting: Obstetrics and Gynecology

## 2023-04-03 DIAGNOSIS — N840 Polyp of corpus uteri: Secondary | ICD-10-CM

## 2023-04-11 ENCOUNTER — Encounter: Payer: Self-pay | Admitting: Obstetrics and Gynecology

## 2023-07-04 ENCOUNTER — Ambulatory Visit: Admission: RE | Admit: 2023-07-04 | Payer: Managed Care, Other (non HMO) | Source: Ambulatory Visit

## 2023-07-04 SURGERY — COLONOSCOPY WITH PROPOFOL
Anesthesia: General

## 2023-07-25 ENCOUNTER — Encounter: Payer: Self-pay | Admitting: Internal Medicine

## 2023-07-25 ENCOUNTER — Other Ambulatory Visit: Payer: Self-pay | Admitting: Student

## 2023-07-25 DIAGNOSIS — Z1231 Encounter for screening mammogram for malignant neoplasm of breast: Secondary | ICD-10-CM

## 2023-08-09 ENCOUNTER — Ambulatory Visit
Admission: RE | Admit: 2023-08-09 | Discharge: 2023-08-09 | Disposition: A | Discharge status: Home / Self Care | Payer: Managed Care, Other (non HMO) | Source: Ambulatory Visit | Attending: Student | Admitting: Student

## 2023-08-09 DIAGNOSIS — Z1231 Encounter for screening mammogram for malignant neoplasm of breast: Secondary | ICD-10-CM | POA: Diagnosis present

## 2023-08-10 ENCOUNTER — Encounter: Payer: Self-pay | Admitting: Obstetrics and Gynecology

## 2023-09-14 ENCOUNTER — Ambulatory Visit

## 2023-09-14 DIAGNOSIS — K640 First degree hemorrhoids: Secondary | ICD-10-CM | POA: Diagnosis not present

## 2023-09-14 DIAGNOSIS — K5730 Diverticulosis of large intestine without perforation or abscess without bleeding: Secondary | ICD-10-CM | POA: Diagnosis not present

## 2023-09-14 DIAGNOSIS — K635 Polyp of colon: Secondary | ICD-10-CM | POA: Diagnosis not present

## 2023-09-14 DIAGNOSIS — Z1211 Encounter for screening for malignant neoplasm of colon: Secondary | ICD-10-CM | POA: Diagnosis present
